# Patient Record
Sex: Female | Born: 1955
Health system: Southern US, Community
[De-identification: ages and names within clinical notes are randomized; demographics above are authoritative.]

## PROBLEM LIST (undated history)

## (undated) DIAGNOSIS — F419 Anxiety disorder, unspecified: Secondary | ICD-10-CM

## (undated) DIAGNOSIS — E785 Hyperlipidemia, unspecified: Secondary | ICD-10-CM

## (undated) DIAGNOSIS — F329 Major depressive disorder, single episode, unspecified: Secondary | ICD-10-CM

## (undated) DIAGNOSIS — M810 Age-related osteoporosis without current pathological fracture: Secondary | ICD-10-CM

## (undated) DIAGNOSIS — K219 Gastro-esophageal reflux disease without esophagitis: Secondary | ICD-10-CM

## (undated) DIAGNOSIS — F32A Depression, unspecified: Secondary | ICD-10-CM

## (undated) HISTORY — DX: Hyperlipidemia, unspecified: E78.5

## (undated) HISTORY — DX: Major depressive disorder, single episode, unspecified: F32.9

## (undated) HISTORY — DX: Depression, unspecified: F32.A

## (undated) HISTORY — PX: COCHLEAR IMPLANT: SUR684

## (undated) HISTORY — PX: WRIST SURGERY: SHX841

## (undated) HISTORY — PX: JOINT REPLACEMENT: SHX530

---

## 2004-03-25 ENCOUNTER — Ambulatory Visit: Payer: Self-pay | Admitting: Family Medicine

## 2004-07-02 ENCOUNTER — Ambulatory Visit: Payer: Self-pay | Admitting: Unknown Physician Specialty

## 2004-08-19 ENCOUNTER — Ambulatory Visit: Payer: Self-pay

## 2004-11-24 ENCOUNTER — Inpatient Hospital Stay: Payer: Self-pay | Admitting: General Practice

## 2005-01-20 ENCOUNTER — Ambulatory Visit: Payer: Self-pay | Admitting: General Practice

## 2005-03-02 ENCOUNTER — Ambulatory Visit: Payer: Self-pay | Admitting: Pain Medicine

## 2005-03-12 ENCOUNTER — Ambulatory Visit: Payer: Self-pay | Admitting: Pain Medicine

## 2005-03-18 ENCOUNTER — Ambulatory Visit: Payer: Self-pay | Admitting: Pain Medicine

## 2005-04-02 ENCOUNTER — Ambulatory Visit: Payer: Self-pay | Admitting: Pain Medicine

## 2005-04-15 ENCOUNTER — Ambulatory Visit: Payer: Self-pay | Admitting: Pain Medicine

## 2005-11-23 ENCOUNTER — Ambulatory Visit: Payer: Self-pay | Admitting: General Practice

## 2006-01-05 ENCOUNTER — Inpatient Hospital Stay: Payer: Self-pay

## 2009-02-27 ENCOUNTER — Ambulatory Visit: Payer: Self-pay

## 2010-03-24 DIAGNOSIS — F4322 Adjustment disorder with anxiety: Secondary | ICD-10-CM | POA: Insufficient documentation

## 2010-03-24 DIAGNOSIS — F39 Unspecified mood [affective] disorder: Secondary | ICD-10-CM | POA: Insufficient documentation

## 2010-12-22 ENCOUNTER — Ambulatory Visit: Payer: Self-pay | Admitting: Unknown Physician Specialty

## 2012-03-10 ENCOUNTER — Emergency Department: Payer: Self-pay | Admitting: Emergency Medicine

## 2012-03-10 LAB — COMPREHENSIVE METABOLIC PANEL
Albumin: 3.9 g/dL (ref 3.4–5.0)
Alkaline Phosphatase: 91 U/L (ref 50–136)
BUN: 9 mg/dL (ref 7–18)
Calcium, Total: 8.8 mg/dL (ref 8.5–10.1)
Co2: 26 mmol/L (ref 21–32)
EGFR (Non-African Amer.): >60
Glucose: 124 mg/dL — ABNORMAL HIGH (ref 65–99)
SGOT(AST): 26 U/L (ref 15–37)
SGPT (ALT): 33 U/L (ref 12–78)
Sodium: 139 mmol/L (ref 136–145)

## 2012-03-10 LAB — CBC WITH DIFFERENTIAL/PLATELET
Basophil %: 0.5 %
Eosinophil #: 0.2 10*3/uL (ref 0.0–0.7)
Eosinophil %: 1.5 %
HCT: 38.7 % (ref 35.0–47.0)
HGB: 12.7 g/dL (ref 12.0–16.0)
Lymphocyte %: 7.8 %
MCHC: 33 g/dL (ref 32.0–36.0)
MCV: 89 fL (ref 80–100)
Monocyte %: 3.4 %
Neutrophil #: 10.7 10*3/uL — ABNORMAL HIGH (ref 1.4–6.5)
Neutrophil %: 86.8 %

## 2012-03-10 LAB — APTT: Activated PTT: 23.1 secs — ABNORMAL LOW (ref 23.6–35.9)

## 2013-01-07 ENCOUNTER — Emergency Department: Payer: Self-pay | Admitting: Emergency Medicine

## 2013-01-07 LAB — URINALYSIS, COMPLETE
Bacteria: NONE SEEN
Bilirubin,UR: NEGATIVE
Ketone: NEGATIVE
Leukocyte Esterase: NEGATIVE
Nitrite: NEGATIVE
Protein: NEGATIVE
RBC,UR: <1 /HPF (ref 0–5)
Specific Gravity: 1.003 (ref 1.003–1.030)
Squamous Epithelial: <1
WBC UR: NONE SEEN /HPF (ref 0–5)

## 2013-01-07 LAB — CBC WITH DIFFERENTIAL/PLATELET
Eosinophil #: 0.2 10*3/uL (ref 0.0–0.7)
Eosinophil %: 2 %
HCT: 37.2 % (ref 35.0–47.0)
MCH: 30.1 pg (ref 26.0–34.0)
MCHC: 34.7 g/dL (ref 32.0–36.0)
MCV: 87 fL (ref 80–100)
Monocyte #: 0.6 x10 3/mm (ref 0.2–0.9)
Neutrophil #: 5.4 10*3/uL (ref 1.4–6.5)
Neutrophil %: 66.9 %
Platelet: 239 10*3/uL (ref 150–440)
RDW: 12.7 % (ref 11.5–14.5)
WBC: 8 10*3/uL (ref 3.6–11.0)

## 2013-01-07 LAB — COMPREHENSIVE METABOLIC PANEL
Bilirubin,Total: 0.4 mg/dL (ref 0.2–1.0)
Chloride: 109 mmol/L — ABNORMAL HIGH (ref 98–107)
Co2: 24 mmol/L (ref 21–32)
Creatinine: 0.71 mg/dL (ref 0.60–1.30)
EGFR (African American): >60
EGFR (Non-African Amer.): >60
Glucose: 88 mg/dL (ref 65–99)
Osmolality: 277 (ref 275–301)
SGPT (ALT): 21 U/L (ref 12–78)
Sodium: 140 mmol/L (ref 136–145)
Total Protein: 6.5 g/dL (ref 6.4–8.2)

## 2014-08-27 ENCOUNTER — Encounter: Payer: Self-pay | Admitting: *Deleted

## 2014-08-27 NOTE — Discharge Instructions (Signed)

## 2014-08-30 ENCOUNTER — Encounter
Admission: RE | Disposition: A | Discharge status: Home / Self Care | Payer: Self-pay | Source: Ambulatory Visit | Attending: Otolaryngology

## 2014-08-30 ENCOUNTER — Ambulatory Visit: Payer: 59 | Admitting: Student in an Organized Health Care Education/Training Program

## 2014-08-30 ENCOUNTER — Encounter: Payer: Self-pay | Admitting: *Deleted

## 2014-08-30 ENCOUNTER — Ambulatory Visit
Admission: RE | Admit: 2014-08-30 | Discharge: 2014-08-30 | Disposition: A | Discharge status: Home / Self Care | Payer: 59 | Source: Ambulatory Visit | Attending: Otolaryngology | Admitting: Otolaryngology

## 2014-08-30 DIAGNOSIS — Z809 Family history of malignant neoplasm, unspecified: Secondary | ICD-10-CM | POA: Insufficient documentation

## 2014-08-30 DIAGNOSIS — Z79899 Other long term (current) drug therapy: Secondary | ICD-10-CM | POA: Diagnosis not present

## 2014-08-30 DIAGNOSIS — Z8261 Family history of arthritis: Secondary | ICD-10-CM | POA: Diagnosis not present

## 2014-08-30 DIAGNOSIS — L814 Other melanin hyperpigmentation: Secondary | ICD-10-CM | POA: Diagnosis not present

## 2014-08-30 DIAGNOSIS — Z9621 Cochlear implant status: Secondary | ICD-10-CM | POA: Diagnosis not present

## 2014-08-30 DIAGNOSIS — L818 Other specified disorders of pigmentation: Secondary | ICD-10-CM | POA: Diagnosis present

## 2014-08-30 DIAGNOSIS — H903 Sensorineural hearing loss, bilateral: Secondary | ICD-10-CM | POA: Diagnosis not present

## 2014-08-30 HISTORY — DX: Anxiety disorder, unspecified: F41.9

## 2014-08-30 HISTORY — DX: Gastro-esophageal reflux disease without esophagitis: K21.9

## 2014-08-30 HISTORY — DX: Age-related osteoporosis without current pathological fracture: M81.0

## 2014-08-30 HISTORY — PX: MINOR EXCISION OF ORAL LESION: SHX6466

## 2014-08-30 SURGERY — MINOR EXCISION OF ORAL LESION
Anesthesia: General | Site: Mouth | Wound class: Clean Contaminated

## 2014-08-30 MED ORDER — LIDOCAINE HCL (CARDIAC) 20 MG/ML IV SOLN
INTRAVENOUS | Status: DC | PRN
  Administered 2014-08-30: 50 mg via INTRATRACHEAL

## 2014-08-30 MED ORDER — LACTATED RINGERS IV SOLN
INTRAVENOUS | Status: DC
  Administered 2014-08-30 (×2): via INTRAVENOUS

## 2014-08-30 MED ORDER — PROMETHAZINE HCL 25 MG/ML IJ SOLN: 6.2500 mg | INTRAMUSCULAR | Status: DC | PRN

## 2014-08-30 MED ORDER — PROPOFOL 10 MG/ML IV BOLUS
INTRAVENOUS | Status: DC | PRN
Start: 2014-08-30 — End: 2014-08-30
  Administered 2014-08-30: 100 mg via INTRAVENOUS

## 2014-08-30 MED ORDER — FENTANYL CITRATE (PF) 100 MCG/2ML IJ SOLN: 25.0000 ug | INTRAMUSCULAR | Status: DC | PRN

## 2014-08-30 MED ORDER — DEXAMETHASONE SODIUM PHOSPHATE 4 MG/ML IJ SOLN
INTRAMUSCULAR | Status: DC | PRN
  Administered 2014-08-30: 4 mg via INTRAVENOUS

## 2014-08-30 MED ORDER — ONDANSETRON HCL 4 MG/2ML IJ SOLN
INTRAMUSCULAR | Status: DC | PRN
  Administered 2014-08-30: 4 mg via INTRAVENOUS

## 2014-08-30 MED ORDER — MIDAZOLAM HCL 5 MG/5ML IJ SOLN
INTRAMUSCULAR | Status: DC | PRN
  Administered 2014-08-30: 2 mg via INTRAVENOUS

## 2014-08-30 MED ORDER — FENTANYL CITRATE (PF) 100 MCG/2ML IJ SOLN
INTRAMUSCULAR | Status: DC | PRN
  Administered 2014-08-30: 50 ug via INTRAVENOUS

## 2014-08-30 MED ORDER — LIDOCAINE-EPINEPHRINE 1 %-1:100000 IJ SOLN
INTRAMUSCULAR | Status: DC | PRN
  Administered 2014-08-30: 1 mL

## 2014-08-30 MED ORDER — OXYCODONE HCL 5 MG/5ML PO SOLN: 5.0000 mg | Freq: Once | ORAL | Status: DC | PRN

## 2014-08-30 MED ORDER — OXYCODONE HCL 5 MG PO TABS: 5.0000 mg | ORAL_TABLET | Freq: Once | ORAL | Status: DC | PRN

## 2014-08-30 SURGICAL SUPPLY — 29 items
BLADE SURG 15 STRL LF DISP TIS (BLADE) ×1 IMPLANT
BLADE SURG 15 STRL SS (BLADE) ×2
CORD BIP STRL DISP 12FT (MISCELLANEOUS) IMPLANT
DRAPE HEAD BAR (DRAPES) IMPLANT
DRESSING TELFA 4X3 1S ST N-ADH (GAUZE/BANDAGES/DRESSINGS) IMPLANT
ELECT CAUTERY BLADE TIP 2.5 (TIP)
ELECT CAUTERY NEEDLE 2.0 MIC (NEEDLE) IMPLANT
ELECTRODE CAUTERY BLDE TIP 2.5 (TIP) IMPLANT
GAUZE SPONGE 4X4 12PLY STRL (GAUZE/BANDAGES/DRESSINGS) IMPLANT
GLOVE PI ULTRA LF STRL 7.5 (GLOVE) ×2 IMPLANT
GLOVE PI ULTRA NON LATEX 7.5 (GLOVE) ×4
LIQUID BAND (GAUZE/BANDAGES/DRESSINGS) IMPLANT
NEEDLE HYPO 25GX1X1/2 BEV (NEEDLE) IMPLANT
NS IRRIG 500ML POUR BTL (IV SOLUTION) IMPLANT
PACK DRAPE NASAL/ENT (PACKS) ×3 IMPLANT
PACK TONSIL/ADENOIDS (PACKS) ×3 IMPLANT
PAD GROUND ADULT SPLIT (MISCELLANEOUS) IMPLANT
PENCIL ELECTRO HAND CTR (MISCELLANEOUS) IMPLANT
SOL ANTI-FOG 6CC FOG-OUT (MISCELLANEOUS) ×1 IMPLANT
SOL FOG-OUT ANTI-FOG 6CC (MISCELLANEOUS) ×2
SOL PREP PVP 2OZ (MISCELLANEOUS)
SOLUTION PREP PVP 2OZ (MISCELLANEOUS) IMPLANT
STRAP BODY AND KNEE 60X3 (MISCELLANEOUS) ×3 IMPLANT
SUCTION FRAZIER TIP 10 FR DISP (SUCTIONS) IMPLANT
SUT PROLENE 5 0 P 3 (SUTURE) IMPLANT
SUT SILK 2 0 PERMA HAND 18 BK (SUTURE) ×3 IMPLANT
SUT VIC AB 4-0 RB1 27 (SUTURE)
SUT VIC AB 4-0 RB1 27X BRD (SUTURE) IMPLANT
SYRINGE 10CC LL (SYRINGE) IMPLANT

## 2014-08-30 NOTE — Anesthesia Postprocedure Evaluation (Signed)
  Anesthesia Post-op Note  Patient: [name]  Procedure(s) Performed: Procedure(s) with comments: MINOR EXCISION OF ORAL LESION (N/A) - HARD PALATE LESION  Anesthesia type:General  Patient location: PACU  Post pain: Pain level controlled  Post assessment: Post-op Vital signs reviewed, Patient's Cardiovascular Status Stable, Respiratory Function Stable, Patent Airway and No signs of Nausea or vomiting  Post vital signs: Reviewed and stable  Last Vitals:  Filed Vitals:   08/30/14 1242  BP:   Pulse: 69  Temp:   Resp: 15    Level of consciousness: awake, alert  and patient cooperative  Complications: No apparent anesthesia complications

## 2014-08-30 NOTE — Anesthesia Preprocedure Evaluation (Signed)
Anesthesia Evaluation  Patient identified by MRN, date of birth, ID band Patient awake    Airway Mallampati: II  TM Distance: >3 FB Neck ROM: Full    Dental   Pulmonary former smoker,          Cardiovascular     Neuro/Psych    GI/Hepatic   Endo/Other    Renal/GU      Musculoskeletal   Abdominal   Peds  Hematology   Anesthesia Other Findings   Reproductive/Obstetrics                             Anesthesia Physical Anesthesia Plan  ASA: II  Anesthesia Plan: General   Post-op Pain Management:    Induction:   Airway Management Planned: LMA  Additional Equipment:   Intra-op Plan:   Post-operative Plan:   Informed Consent: I have reviewed the patients History and Physical, chart, labs and discussed the procedure including the risks, benefits and alternatives for the proposed anesthesia with the patient or authorized representative who has indicated his/her understanding and acceptance.     Plan Discussed with: CRNA  Anesthesia Plan Comments:         Anesthesia Quick Evaluation

## 2014-08-30 NOTE — Anesthesia Procedure Notes (Signed)
Procedure Name: LMA Insertion Performed by: Lamiracle Chaidez Pre-anesthesia Checklist: Patient identified, Emergency Drugs available, Suction available, Timeout performed and Patient being monitored Patient Re-evaluated:Patient Re-evaluated prior to inductionOxygen Delivery Method: Circle system utilized Preoxygenation: Pre-oxygenation with 100% oxygen Intubation Type: IV induction LMA: LMA inserted LMA Size: 4.0 Number of attempts: 1 Placement Confirmation: positive ETCO2 and breath sounds checked- equal and bilateral Tube secured with: Tape       

## 2014-08-30 NOTE — Transfer of Care (Signed)
Immediate Anesthesia Transfer of Care Note  Patient: Cindy Neal  Procedure(s) Performed: Procedure(s) with comments: MINOR EXCISION OF ORAL LESION (N/A) - HARD PALATE LESION  Patient Location: PACU  Anesthesia Type: General  Level of Consciousness: awake, alert  and patient cooperative  Airway and Oxygen Therapy: Patient Spontanous Breathing and Patient connected to supplemental oxygen  Post-op Assessment: Post-op Vital signs reviewed, Patient's Cardiovascular Status Stable, Respiratory Function Stable, Patent Airway and No signs of Nausea or vomiting  Post-op Vital Signs: Reviewed and stable  Complications: No apparent anesthesia complications

## 2014-08-30 NOTE — Op Note (Signed)
08/30/2014  12:33 PM    [name], Cindy Neal  [id number]   Pre-Op Dx:  Pigmented hard palate lesion  Post-op Dx: Pigmented hard palate lesion  Proc: Excision hard palate lesion   Surg:  Cindy Neal  Anes:  GOT  EBL:  Minimal  Comp:  None  Findings:  The patient has a gray-brown pigmented area on the hard palate in the midline near the apex of the hard palate. This was first noted above month or so ago. It is about 4-5 mm in size.  Procedure: The patient was given general anesthesia by LMA. She was in a supine position with her head extended. The hard palate was visualized and the pigmented lesion could be seen in the midline. 1 mL of 1% Xylocaine with epi 1-100,000 was used for infiltration around the lesion. Vasoconstriction could be seen.  Using a sharp scalpel and incision was created around the lesion leaving a millimeter to 2 margin. A freer elevator was used to elevate this all the way down to periosteum and remove the lesion. It was marked with a suture at the anterior margin. Silver nitrate cautery was used for controlling bleeding. There was minimal bleeding and the silver nitrate controlled well.  Dispo:   The patient was awakened and taken to the recovery room in satisfactory condition, to be discharged home from there.  Plan:  She is to follow-up in the office in 1 week to review pathology and make sure the palate is healing well. She'll rinse her mouth with water after each time she eats. She can use Norco 5/325 for pain.  Cindy Neal  08/30/2014 12:33 PM

## 2014-08-30 NOTE — H&P (Signed)
  H&P has been reviewed and no changes necessary. To be downloaded later. 

## 2014-09-01 ENCOUNTER — Encounter: Payer: Self-pay | Admitting: Otolaryngology

## 2014-09-05 LAB — SURGICAL PATHOLOGY

## 2014-10-30 DIAGNOSIS — E785 Hyperlipidemia, unspecified: Secondary | ICD-10-CM | POA: Insufficient documentation

## 2014-10-30 DIAGNOSIS — F329 Major depressive disorder, single episode, unspecified: Secondary | ICD-10-CM | POA: Insufficient documentation

## 2014-10-30 DIAGNOSIS — E039 Hypothyroidism, unspecified: Secondary | ICD-10-CM | POA: Insufficient documentation

## 2014-10-30 DIAGNOSIS — F411 Generalized anxiety disorder: Secondary | ICD-10-CM | POA: Insufficient documentation

## 2014-10-30 DIAGNOSIS — K219 Gastro-esophageal reflux disease without esophagitis: Secondary | ICD-10-CM | POA: Insufficient documentation

## 2014-10-30 DIAGNOSIS — H9190 Unspecified hearing loss, unspecified ear: Secondary | ICD-10-CM | POA: Insufficient documentation

## 2014-10-30 DIAGNOSIS — F3340 Major depressive disorder, recurrent, in remission, unspecified: Secondary | ICD-10-CM

## 2014-10-31 ENCOUNTER — Ambulatory Visit (INDEPENDENT_AMBULATORY_CARE_PROVIDER_SITE_OTHER): Payer: 59 | Admitting: Unknown Physician Specialty

## 2014-10-31 ENCOUNTER — Encounter: Payer: Self-pay | Admitting: Unknown Physician Specialty

## 2014-10-31 VITALS — BP 117/77 | HR 69 | Temp 97.3°F | Ht 65.1 in | Wt 163.2 lb

## 2014-10-31 DIAGNOSIS — J42 Unspecified chronic bronchitis: Secondary | ICD-10-CM

## 2014-10-31 DIAGNOSIS — J069 Acute upper respiratory infection, unspecified: Secondary | ICD-10-CM

## 2014-10-31 DIAGNOSIS — J209 Acute bronchitis, unspecified: Secondary | ICD-10-CM | POA: Diagnosis not present

## 2014-10-31 MED ORDER — HYDROCOD POLST-CPM POLST ER 10-8 MG/5ML PO SUER: 5.0000 mL | Freq: Two times a day (BID) | ORAL | Status: DC | PRN

## 2014-10-31 MED ORDER — DOXYCYCLINE HYCLATE 100 MG PO TABS
100.0000 mg | ORAL_TABLET | Freq: Two times a day (BID) | ORAL | Status: DC
Start: 2014-10-31 — End: 2015-01-08

## 2014-10-31 NOTE — Progress Notes (Signed)
   BP 117/77 mmHg  Pulse 69  Temp(Src) 97.3 F (36.3 C)  Ht 5' 5.1" (1.654 m)  Wt 163 lb 3.2 oz (74.027 kg)  BMI 27.06 kg/m2  SpO2 95%  LMP  (LMP Unknown)   Subjective:    Patient ID: Cindy Neal, female    DOB: 12/13/1955, 59 y.o.   MRN: [id number]  HPI: Cindy Neal is a 59 y.o. female  Chief Complaint  Patient presents with  . Sore Throat    pt states throat has been sore since 10/24/14  . Cough    pt states non-productive cough started a week ago, then cough became productive about 2 days ago  . Wheezing    pt states wheezing started last Thursday   URI  This is a new problem. The current episode started in the past 7 days. The problem has been unchanged. There has been no fever (having chills.  doesn't take temperature). Associated symptoms include congestion, coughing, a sore throat and wheezing. Treatments tried: throat spray.    Relevant past medical, surgical, family and social history reviewed and updated as indicated. Interim medical history since our last visit reviewed. Allergies and medications reviewed and updated.  Review of Systems  HENT: Positive for congestion and sore throat.   Respiratory: Positive for cough and wheezing.     Per HPI unless specifically indicated above     Objective:    BP 117/77 mmHg  Pulse 69  Temp(Src) 97.3 F (36.3 C)  Ht 5' 5.1" (1.654 m)  Wt 163 lb 3.2 oz (74.027 kg)  BMI 27.06 kg/m2  SpO2 95%  LMP  (LMP Unknown)  Wt Readings from Last 3 Encounters:  10/31/14 163 lb 3.2 oz (74.027 kg)  06/14/14 164 lb (74.39 kg)  08/30/14 160 lb (72.576 kg)    Physical Exam  Constitutional: She is oriented to person, place, and time. She appears well-developed and well-nourished. No distress.  HENT:  Head: Normocephalic and atraumatic.  Eyes: Conjunctivae and lids are normal. Right eye exhibits no discharge. Left eye exhibits no discharge. No scleral icterus.  Cardiovascular: Normal rate, regular rhythm and normal heart  sounds.   Pulmonary/Chest: Effort normal. No respiratory distress. She has wheezes in the right lower field. She has rhonchi in the right lower field.  Abdominal: Normal appearance and bowel sounds are normal. She exhibits no distension. There is no splenomegaly or hepatomegaly. There is no tenderness.  Musculoskeletal: Normal range of motion.  Neurological: She is alert and oriented to person, place, and time.  Skin: Skin is intact. No rash noted. No pallor.  Psychiatric: She has a normal mood and affect. Her behavior is normal. Judgment and thought content normal.  Vitals reviewed.     Assessment & Plan:   Problem List Items Addressed This Visit    None    Visit Diagnoses    Upper respiratory infection    -  Primary    Acute exacerbation of chronic bronchitis        r/o pneumonia with Doxycycline    Relevant Medications    chlorpheniramine-HYDROcodone (TUSSIONEX PENNKINETIC ER) 10-8 MG/5ML SUER        Follow up plan: Return if symptoms worsen or fail to improve.

## 2014-11-26 ENCOUNTER — Telehealth: Payer: Self-pay | Admitting: Unknown Physician Specialty

## 2014-11-26 NOTE — Telephone Encounter (Signed)
Patient husband called to cancel wife appt with Elnita Maxwell. He said they do not want her to be treated my Gabriel Cirri. Told patient husband that next thing for a physical with another provider wouldn't be until 1st week of September, husband was upset and hang the phone up.

## 2014-12-04 ENCOUNTER — Encounter: Payer: 59 | Admitting: Unknown Physician Specialty

## 2014-12-19 ENCOUNTER — Other Ambulatory Visit: Payer: Self-pay | Admitting: Family Medicine

## 2014-12-19 NOTE — Telephone Encounter (Signed)
Pt needs check further refills 

## 2015-01-08 ENCOUNTER — Encounter: Payer: Self-pay | Admitting: Unknown Physician Specialty

## 2015-01-08 ENCOUNTER — Ambulatory Visit (INDEPENDENT_AMBULATORY_CARE_PROVIDER_SITE_OTHER): Payer: 59 | Admitting: Unknown Physician Specialty

## 2015-01-08 VITALS — BP 136/82 | HR 90 | Temp 97.9°F | Ht 63.7 in | Wt 162.6 lb

## 2015-01-08 DIAGNOSIS — Z0000 Encounter for general adult medical examination without abnormal findings: Secondary | ICD-10-CM | POA: Diagnosis not present

## 2015-01-08 DIAGNOSIS — T8484XA Pain due to internal orthopedic prosthetic devices, implants and grafts, initial encounter: Secondary | ICD-10-CM

## 2015-01-08 DIAGNOSIS — E039 Hypothyroidism, unspecified: Secondary | ICD-10-CM

## 2015-01-08 DIAGNOSIS — Z23 Encounter for immunization: Secondary | ICD-10-CM | POA: Diagnosis not present

## 2015-01-08 DIAGNOSIS — Z96649 Presence of unspecified artificial hip joint: Secondary | ICD-10-CM

## 2015-01-08 MED ORDER — LEVOTHYROXINE SODIUM 112 MCG PO TABS: 112.0000 ug | ORAL_TABLET | Freq: Every day | ORAL | Status: DC

## 2015-01-08 MED ORDER — RALOXIFENE HCL 60 MG PO TABS: 60.0000 mg | ORAL_TABLET | Freq: Every day | ORAL | Status: DC

## 2015-01-08 MED ORDER — ZOSTER VACCINE LIVE 19400 UNT/0.65ML ~~LOC~~ SOLR: 0.6500 mL | Freq: Once | SUBCUTANEOUS | Status: DC

## 2015-01-08 NOTE — Patient Instructions (Signed)
Write down everything you eat.    Think you're too busy to work out? We have the workout for you. In minutes, high-intensity interval training (H.I.I.T.) will have you sweating, breathing hard and maximizing the health benefits of exercise without the time commitment. Best of all, it's scientifically proven to work.  What Is H.I.I.T.? SHORT WORKOUTS 101 High-intensity interval training - referred to as H.I.I.T. - is based on the idea that short bursts of strenuous exercise can have a big impact on the body. If moderate exercise - like a 20-minute jog - is good for your heart, lungs and metabolism, H.I.I.T. packs the benefits of that workout and more into a few minutes. It may sound too good to be true, but learning this exercise technique and adapting it to your life can mean saving hours at the gym. If you think you don't have time to exercise, H.I.I.T. may be the workout for you.  You can try it with any aerobic activity you like. The principles of H.I.I.T. can be applied to running, biking, stair climbing, swimming, jumping rope, rowing, even hopping or skipping. (Yes, skipping!)  The downside? Even though H.I.I.T. lasts only minutes, the workouts are tough, requiring you to push your body near its limit.  HOW INTENSE IS HIGH INTENSITY? High-intensity exercise is obviously not a casual stroll down the street, but it's not a run-till-your-lungs-pop explosion, either. Think breathless, not winded. Heart-pounding, not exploding. Legs pumping, but not uncontrolled.  You don't need any fancy heart rate monitors to do these workouts. Use cues from your body as a guide. In the middle of a high-intensity workout you should be able to say single words, but not complete whole sentences. So, if you can keep chatting to your workout partner during this workout, pump it up a few notches.  01-30-29 Training This simple program will help you make the most of a short workout by improving heart health and  endurance. Try it with your favorite cardiovascular activity. The essentials of 01-30-29 training are simple. Run, ride or perhaps row on a rowing machine gently for 30 seconds, accelerate to a moderate pace for 20 seconds, then sprint as hard as you can for 10 seconds. (It should be called 30-20-10 training, obviously, but that is not as catchy.) Repeat.  You don't even need a stopwatch to monitor the 30-, 20-, and 10-second time changes. You can just count to yourself, which seems to make the intervals pass more quickly.  Best of all? The grueling, all-out portion of the workout lasts for only 10 seconds. C'mon, you can do anything for 10 seconds, right?  Got 10 Minutes? A solitary minute of hard work buried in 10 minutes of activity can make a big difference.  The 10-Minute Workout If you like to run, bike, row or swim - just a little bit - this workout is a great option for you. Step 1 Warm up for 2 minutes Step 2 Pedal, run or swim all-out for 20 seconds. Repeat 2 more times Warm down for 3 minutes    GET STARTED To benefit the most from really, really short workouts, you need to build the habit of doing them into your hectic life. Ideally, you'll complete the workout three times a week. The best way to build that habit is to start small and be willing to tweak your schedule where you can to accommodate your new workout.  First set up a spot in your house for your workout, equipped with whatever you  need to get the job done: sneakers, a chair, a towel, etc. Then slot your workout in before you would normally shower. (You can even do it in the bathroom.) Or wake up five minutes earlier and do it first thing in the morning, so you can head off to work feeling accomplished. Or do it during your lunch hour. Run up your office's stairs or grab a private conference room for just a few minutes. Or work it into your commute. If you walk or bike to work, add some heavy intervals on the way  home.  GET A BOOST FROM MUSIC Creating a workout playlist of high-energy tunes you love will not make your workout feel easier, but it may cause you to exercise harder without even realizing it. Best of all, if you are doing a really short workout, you need only one or two great tunes to get you through. If you are willing to try something a bit different, make your own music as you exercise. Sing, hum, clap your hands, whatever you can do to jam along to your playlist. It may give you an extra boost to finish strong.  Find a song or podcast that's the length of your really, really short workout. By the time the song is over, you're done.  Excerpted from the [place] [employer] Well column http://www.nytimes.com/well/guides/really-really-short-workouts?smid=fb-nytwell&smtyp=pay

## 2015-01-08 NOTE — Progress Notes (Signed)
BP 136/82 mmHg  Pulse 90  Temp(Src) 97.9 F (36.6 C)  Ht 5' 3.7" (1.618 m)  Wt 162 lb 9.6 oz (73.755 kg)  BMI 28.17 kg/m2  SpO2 95%  LMP  (LMP Unknown)   Subjective:    Patient ID: Cindy Neal, female    DOB: December 13, 1955, 59 y.o.   MRN: [id number]  HPI: Cindy Neal is a 59 y.o. female  Chief Complaint  Patient presents with  . Annual Exam   Pt is frustrated with weight.  She states she can lose "5 pounds" and that is it.    Depression Stable on present meds.  PHQ 2 is 0    Relevant past medical, surgical, family and social history reviewed and updated as indicated. Interim medical history since our last visit reviewed. Allergies and medications reviewed and updated.  Review of Systems  Constitutional:       Frustrated with weight gain  HENT: Negative.   Eyes: Negative.   Respiratory: Negative.   Cardiovascular: Negative.   Gastrointestinal: Negative.   Endocrine: Negative.   Genitourinary: Negative.   Musculoskeletal: Negative.        Right hip bothering her.  S/p hip replacement 10 years ago with a "new hip" ? MOM and a recalled hip  Skin: Negative.   Allergic/Immunologic: Negative.   Neurological: Negative.   Hematological: Negative.   Psychiatric/Behavioral: Negative.     Per HPI unless specifically indicated above     Objective:    BP 136/82 mmHg  Pulse 90  Temp(Src) 97.9 F (36.6 C)  Ht 5' 3.7" (1.618 m)  Wt 162 lb 9.6 oz (73.755 kg)  BMI 28.17 kg/m2  SpO2 95%  LMP  (LMP Unknown)  Wt Readings from Last 3 Encounters:  01/08/15 162 lb 9.6 oz (73.755 kg)  10/31/14 163 lb 3.2 oz (74.027 kg)  06/14/14 164 lb (74.39 kg)    Physical Exam  Constitutional: She is oriented to person, place, and time. She appears well-developed and well-nourished.  HENT:  Head: Normocephalic and atraumatic.  Eyes: Pupils are equal, round, and reactive to light. Right eye exhibits no discharge. Left eye exhibits no discharge. No scleral icterus.  Neck: Normal  range of motion. Neck supple. Carotid bruit is not present. No thyromegaly present.  Cardiovascular: Normal rate, regular rhythm and normal heart sounds.  Exam reveals no gallop and no friction rub.   No murmur heard. Pulmonary/Chest: Effort normal and breath sounds normal. No respiratory distress. She has no wheezes. She has no rales.  Abdominal: Soft. Bowel sounds are normal. There is no tenderness. There is no rebound.  Genitourinary: No breast swelling, tenderness or discharge.  Musculoskeletal: Normal range of motion.  Lymphadenopathy:    She has no cervical adenopathy.  Neurological: She is alert and oriented to person, place, and time.  Skin: Skin is warm, dry and intact. No rash noted.  Psychiatric: She has a normal mood and affect. Her speech is normal and behavior is normal. Judgment and thought content normal. Cognition and memory are normal.    Assessment & Plan:   Problem List Items Addressed This Visit    None    Visit Diagnoses    Immunization due    -  Primary    Relevant Orders    Flu Vaccine QUAD 36+ mos PF IM (Fluarix & Fluzone Quad PF) (Completed)    Routine general medical examination at a health care facility        Relevant Orders  CBC with Differential/Platelet    Comprehensive metabolic panel    Hepatitis C antibody    Lipid Panel w/o Chol/HDL Ratio    TSH    Hypothyroidism, unspecified hypothyroidism type        Relevant Medications    levothyroxine (SYNTHROID, LEVOTHROID) 112 MCG tablet    Hip pain associated with recalled total hip arthroplasty hardware, initial encounter        Relevant Orders    Ambulatory referral to Orthopedic Surgery        Follow up plan: No Follow-up on file.

## 2015-01-09 ENCOUNTER — Encounter: Payer: Self-pay | Admitting: Unknown Physician Specialty

## 2015-01-09 LAB — CBC WITH DIFFERENTIAL/PLATELET
BASOS ABS: 0 10*3/uL (ref 0.0–0.2)
BASOS: 0 %
EOS (ABSOLUTE): 0.2 10*3/uL (ref 0.0–0.4)
Eos: 3 %
Hematocrit: 39.4 % (ref 34.0–46.6)
Hemoglobin: 13.2 g/dL (ref 11.1–15.9)
IMMATURE GRANS (ABS): 0 10*3/uL (ref 0.0–0.1)
Immature Granulocytes: 0 %
LYMPHS: 32 %
Lymphocytes Absolute: 2.1 10*3/uL (ref 0.7–3.1)
MCH: 29.3 pg (ref 26.6–33.0)
MCHC: 33.5 g/dL (ref 31.5–35.7)
MCV: 88 fL (ref 79–97)
Monocytes Absolute: 0.4 10*3/uL (ref 0.1–0.9)
Monocytes: 6 %
NEUTROS ABS: 3.9 10*3/uL (ref 1.4–7.0)
Neutrophils: 59 %
PLATELETS: 268 10*3/uL (ref 150–379)
RBC: 4.5 x10E6/uL (ref 3.77–5.28)
RDW: 13.8 % (ref 12.3–15.4)
WBC: 6.7 10*3/uL (ref 3.4–10.8)

## 2015-01-09 LAB — COMPREHENSIVE METABOLIC PANEL
ALT: 15 IU/L (ref 0–32)
AST: 14 IU/L (ref 0–40)
Albumin/Globulin Ratio: 2.4 (ref 1.1–2.5)
Albumin: 4.3 g/dL (ref 3.6–4.8)
Alkaline Phosphatase: 84 IU/L (ref 39–117)
BUN/Creatinine Ratio: 10 — ABNORMAL LOW (ref 11–26)
BUN: 6 mg/dL — AB (ref 8–27)
Bilirubin Total: <0.2 mg/dL (ref 0.0–1.2)
CALCIUM: 9.3 mg/dL (ref 8.7–10.3)
CO2: 22 mmol/L (ref 18–29)
Chloride: 104 mmol/L (ref 97–108)
Creatinine, Ser: 0.63 mg/dL (ref 0.57–1.00)
GFR calc Af Amer: 113 mL/min/{1.73_m2} (ref >59)
GFR, EST NON AFRICAN AMERICAN: 98 mL/min/{1.73_m2} (ref >59)
GLUCOSE: 87 mg/dL (ref 65–99)
Globulin, Total: 1.8 g/dL (ref 1.5–4.5)
Potassium: 4.3 mmol/L (ref 3.5–5.2)
Sodium: 142 mmol/L (ref 134–144)
TOTAL PROTEIN: 6.1 g/dL (ref 6.0–8.5)

## 2015-01-09 LAB — LIPID PANEL W/O CHOL/HDL RATIO
Cholesterol, Total: 204 mg/dL — ABNORMAL HIGH (ref 100–199)
HDL: 53 mg/dL (ref >39)
LDL CALC: 101 mg/dL — AB (ref 0–99)
TRIGLYCERIDES: 251 mg/dL — AB (ref 0–149)
VLDL CHOLESTEROL CAL: 50 mg/dL — AB (ref 5–40)

## 2015-01-09 LAB — TSH: TSH: 3.16 u[IU]/mL (ref 0.450–4.500)

## 2015-01-09 LAB — HEPATITIS C ANTIBODY: Hep C Virus Ab: <0.1 s/co ratio (ref 0.0–0.9)

## 2015-02-19 ENCOUNTER — Other Ambulatory Visit: Payer: Self-pay | Admitting: Family Medicine

## 2015-03-29 ENCOUNTER — Other Ambulatory Visit: Payer: Self-pay | Admitting: Unknown Physician Specialty

## 2015-03-29 NOTE — Telephone Encounter (Signed)
Last TSH normal; Rx approved 

## 2015-04-12 ENCOUNTER — Observation Stay
Admission: EM | Admit: 2015-04-12 | Discharge: 2015-04-14 | Disposition: A | Discharge status: Home / Self Care | Payer: 59 | Attending: Surgery | Admitting: Surgery

## 2015-04-12 ENCOUNTER — Encounter: Payer: Self-pay | Admitting: Medical Oncology

## 2015-04-12 ENCOUNTER — Emergency Department: Payer: 59

## 2015-04-12 DIAGNOSIS — F411 Generalized anxiety disorder: Secondary | ICD-10-CM | POA: Insufficient documentation

## 2015-04-12 DIAGNOSIS — R109 Unspecified abdominal pain: Secondary | ICD-10-CM

## 2015-04-12 DIAGNOSIS — K219 Gastro-esophageal reflux disease without esophagitis: Secondary | ICD-10-CM | POA: Diagnosis not present

## 2015-04-12 DIAGNOSIS — R1013 Epigastric pain: Secondary | ICD-10-CM | POA: Insufficient documentation

## 2015-04-12 DIAGNOSIS — F4322 Adjustment disorder with anxiety: Secondary | ICD-10-CM | POA: Diagnosis not present

## 2015-04-12 DIAGNOSIS — R1031 Right lower quadrant pain: Secondary | ICD-10-CM | POA: Insufficient documentation

## 2015-04-12 DIAGNOSIS — H9190 Unspecified hearing loss, unspecified ear: Secondary | ICD-10-CM | POA: Diagnosis not present

## 2015-04-12 DIAGNOSIS — E785 Hyperlipidemia, unspecified: Secondary | ICD-10-CM | POA: Insufficient documentation

## 2015-04-12 DIAGNOSIS — E039 Hypothyroidism, unspecified: Secondary | ICD-10-CM | POA: Insufficient documentation

## 2015-04-12 DIAGNOSIS — F39 Unspecified mood [affective] disorder: Secondary | ICD-10-CM | POA: Diagnosis not present

## 2015-04-12 DIAGNOSIS — K819 Cholecystitis, unspecified: Secondary | ICD-10-CM | POA: Diagnosis present

## 2015-04-12 DIAGNOSIS — Z9621 Cochlear implant status: Secondary | ICD-10-CM | POA: Diagnosis not present

## 2015-04-12 DIAGNOSIS — K449 Diaphragmatic hernia without obstruction or gangrene: Secondary | ICD-10-CM | POA: Diagnosis not present

## 2015-04-12 DIAGNOSIS — K8000 Calculus of gallbladder with acute cholecystitis without obstruction: Principal | ICD-10-CM | POA: Insufficient documentation

## 2015-04-12 DIAGNOSIS — Z87891 Personal history of nicotine dependence: Secondary | ICD-10-CM | POA: Diagnosis not present

## 2015-04-12 DIAGNOSIS — Z96641 Presence of right artificial hip joint: Secondary | ICD-10-CM | POA: Insufficient documentation

## 2015-04-12 DIAGNOSIS — N390 Urinary tract infection, site not specified: Secondary | ICD-10-CM | POA: Diagnosis not present

## 2015-04-12 DIAGNOSIS — M810 Age-related osteoporosis without current pathological fracture: Secondary | ICD-10-CM | POA: Insufficient documentation

## 2015-04-12 DIAGNOSIS — F329 Major depressive disorder, single episode, unspecified: Secondary | ICD-10-CM | POA: Insufficient documentation

## 2015-04-12 LAB — URINALYSIS COMPLETE WITH MICROSCOPIC (ARMC ONLY)
Bilirubin Urine: NEGATIVE
Glucose, UA: NEGATIVE mg/dL
KETONES UR: NEGATIVE mg/dL
NITRITE: NEGATIVE
Protein, ur: NEGATIVE mg/dL
Specific Gravity, Urine: 1.005 (ref 1.005–1.030)
pH: 5 (ref 5.0–8.0)

## 2015-04-12 LAB — COMPREHENSIVE METABOLIC PANEL
ALK PHOS: 86 U/L (ref 38–126)
ALT: 20 U/L (ref 14–54)
ANION GAP: 10 (ref 5–15)
AST: 15 U/L (ref 15–41)
Albumin: 4 g/dL (ref 3.5–5.0)
BUN: 9 mg/dL (ref 6–20)
CO2: 26 mmol/L (ref 22–32)
Calcium: 9.8 mg/dL (ref 8.9–10.3)
Chloride: 103 mmol/L (ref 101–111)
Creatinine, Ser: 0.74 mg/dL (ref 0.44–1.00)
GFR calc Af Amer: >60 mL/min (ref >60)
GFR calc non Af Amer: >60 mL/min (ref >60)
GLUCOSE: 103 mg/dL — AB (ref 65–99)
POTASSIUM: 3.5 mmol/L (ref 3.5–5.1)
SODIUM: 139 mmol/L (ref 135–145)
Total Bilirubin: 0.9 mg/dL (ref 0.3–1.2)
Total Protein: 7.3 g/dL (ref 6.5–8.1)

## 2015-04-12 LAB — CBC WITH DIFFERENTIAL/PLATELET
Basophils Absolute: 0.1 10*3/uL (ref 0–0.1)
Basophils Relative: 1 %
EOS ABS: 0.1 10*3/uL (ref 0–0.7)
Eosinophils Relative: 1 %
HCT: 40.7 % (ref 35.0–47.0)
Hemoglobin: 13.4 g/dL (ref 12.0–16.0)
LYMPHS ABS: 1.7 10*3/uL (ref 1.0–3.6)
LYMPHS PCT: 14 %
MCH: 29 pg (ref 26.0–34.0)
MCHC: 33.1 g/dL (ref 32.0–36.0)
MCV: 87.8 fL (ref 80.0–100.0)
Monocytes Absolute: 1.1 10*3/uL — ABNORMAL HIGH (ref 0.2–0.9)
Monocytes Relative: 9 %
Neutro Abs: 9.4 10*3/uL — ABNORMAL HIGH (ref 1.4–6.5)
Neutrophils Relative %: 75 %
Platelets: 265 10*3/uL (ref 150–440)
RBC: 4.63 MIL/uL (ref 3.80–5.20)
RDW: 13.2 % (ref 11.5–14.5)
WBC: 12.3 10*3/uL — AB (ref 3.6–11.0)

## 2015-04-12 LAB — LIPASE, BLOOD: Lipase: 15 U/L (ref 11–51)

## 2015-04-12 MED ORDER — ARIPIPRAZOLE 2 MG PO TABS
10.0000 mg | ORAL_TABLET | Freq: Every day | ORAL | Status: DC
  Administered 2015-04-13 – 2015-04-14 (×2): 10 mg via ORAL
  Filled 2015-04-12: qty 5
  Filled 2015-04-12: qty 1

## 2015-04-12 MED ORDER — DEXTROSE 5 % IV SOLN
1.0000 g | Freq: Once | INTRAVENOUS | Status: AC
  Administered 2015-04-12: 1 g via INTRAVENOUS
  Filled 2015-04-12: qty 10

## 2015-04-12 MED ORDER — DEXTROSE 5 % IV SOLN
2.0000 g | INTRAVENOUS | Status: DC
  Administered 2015-04-13 – 2015-04-14 (×2): 2 g via INTRAVENOUS
  Filled 2015-04-12 (×2): qty 2

## 2015-04-12 MED ORDER — KETOROLAC TROMETHAMINE 30 MG/ML IJ SOLN: 30.0000 mg | Freq: Four times a day (QID) | INTRAMUSCULAR | Status: DC | PRN

## 2015-04-12 MED ORDER — MORPHINE SULFATE (PF) 2 MG/ML IV SOLN
INTRAVENOUS | Status: AC
  Administered 2015-04-12: 2 mg via INTRAVENOUS
  Filled 2015-04-12: qty 1

## 2015-04-12 MED ORDER — LACTATED RINGERS IV SOLN
INTRAVENOUS | Status: DC
  Administered 2015-04-12 – 2015-04-14 (×4): via INTRAVENOUS

## 2015-04-12 MED ORDER — PANTOPRAZOLE SODIUM 40 MG PO TBEC
40.0000 mg | DELAYED_RELEASE_TABLET | Freq: Every day | ORAL | Status: DC
  Administered 2015-04-13 – 2015-04-14 (×2): 40 mg via ORAL
  Filled 2015-04-12 (×2): qty 1

## 2015-04-12 MED ORDER — ALPRAZOLAM 0.5 MG PO TABS
0.5000 mg | ORAL_TABLET | Freq: Two times a day (BID) | ORAL | Status: DC
  Administered 2015-04-13 – 2015-04-14 (×3): 0.5 mg via ORAL
  Filled 2015-04-12 (×3): qty 1

## 2015-04-12 MED ORDER — ONDANSETRON 4 MG PO TBDP: 4.0000 mg | ORAL_TABLET | Freq: Four times a day (QID) | ORAL | Status: DC | PRN

## 2015-04-12 MED ORDER — MORPHINE SULFATE (PF) 2 MG/ML IV SOLN
2.0000 mg | INTRAVENOUS | Status: DC | PRN
  Administered 2015-04-12 – 2015-04-13 (×5): 2 mg via INTRAVENOUS
  Filled 2015-04-12 (×4): qty 1

## 2015-04-12 MED ORDER — BUPROPION HCL ER (XL) 150 MG PO TB24
300.0000 mg | ORAL_TABLET | Freq: Every day | ORAL | Status: DC
  Administered 2015-04-13 – 2015-04-14 (×2): 300 mg via ORAL
  Filled 2015-04-12: qty 1
  Filled 2015-04-12: qty 2

## 2015-04-12 MED ORDER — OXYCODONE-ACETAMINOPHEN 5-325 MG PO TABS
1.0000 | ORAL_TABLET | ORAL | Status: DC | PRN
  Administered 2015-04-13: 1 via ORAL
  Administered 2015-04-14: 2 via ORAL
  Administered 2015-04-14: 1 via ORAL
  Administered 2015-04-14: 2 via ORAL
  Filled 2015-04-12: qty 1
  Filled 2015-04-12: qty 2
  Filled 2015-04-12: qty 1
  Filled 2015-04-12: qty 2

## 2015-04-12 MED ORDER — ONDANSETRON HCL 4 MG/2ML IJ SOLN: 4.0000 mg | Freq: Four times a day (QID) | INTRAMUSCULAR | Status: DC | PRN

## 2015-04-12 MED ORDER — DIPHENHYDRAMINE HCL 12.5 MG/5ML PO ELIX
12.5000 mg | ORAL_SOLUTION | Freq: Four times a day (QID) | ORAL | Status: DC | PRN
  Filled 2015-04-12: qty 5

## 2015-04-12 MED ORDER — DIPHENHYDRAMINE HCL 50 MG/ML IJ SOLN: 12.5000 mg | Freq: Four times a day (QID) | INTRAMUSCULAR | Status: DC | PRN

## 2015-04-12 MED ORDER — MIRTAZAPINE 15 MG PO TABS
30.0000 mg | ORAL_TABLET | Freq: Every day | ORAL | Status: DC
  Administered 2015-04-12 – 2015-04-13 (×2): 30 mg via ORAL
  Filled 2015-04-12: qty 1
  Filled 2015-04-12: qty 2
  Filled 2015-04-12: qty 1

## 2015-04-12 MED ORDER — LEVOTHYROXINE SODIUM 112 MCG PO TABS
112.0000 ug | ORAL_TABLET | Freq: Every morning | ORAL | Status: DC
  Administered 2015-04-13 – 2015-04-14 (×2): 112 ug via ORAL
  Filled 2015-04-12 (×2): qty 1

## 2015-04-12 MED ORDER — IOHEXOL 240 MG/ML SOLN
25.0000 mL | Freq: Once | INTRAMUSCULAR | Status: AC | PRN
  Administered 2015-04-12: 25 mL via ORAL

## 2015-04-12 MED ORDER — IOHEXOL 300 MG/ML  SOLN
100.0000 mL | Freq: Once | INTRAMUSCULAR | Status: AC | PRN
  Administered 2015-04-12: 100 mL via INTRAVENOUS

## 2015-04-12 MED ORDER — IBUPROFEN 400 MG PO TABS
600.0000 mg | ORAL_TABLET | Freq: Four times a day (QID) | ORAL | Status: DC | PRN
  Filled 2015-04-12: qty 1

## 2015-04-12 MED ORDER — HYDRALAZINE HCL 20 MG/ML IJ SOLN: 10.0000 mg | INTRAMUSCULAR | Status: DC | PRN

## 2015-04-12 NOTE — H&P (Signed)
Cindy Neal is an 59 y.o. female.   Chief Complaint: Epigastric and Right sided abdominal pain HPI:  59 yr old female who comes with the complaint of epigastric pain starting yesterday afternoon after eating hamburger and fries.  Patient states pain comes on shortly after eating and was a 6 of 10 and did not radiate. She endorsed bloating, increased flatus, chills, fatigue and malaise along with pain.  She states that was able to eventually sleep and the pain lasted throughout the night but then moved to her right side and began to worsen.  She states that she was not nauseated but had no appetite.  She denies ever having any pain like this before.  She also denies any chest pain, palpitation, sob, nausea, vomiting, diarrhea, constipation, melena, dysuria or hematuria.   Past Medical History  Diagnosis Date  . Anxiety   . Osteoporosis   . GERD (gastroesophageal reflux disease)   . Hypothyroidism   . Depression   . Hyperlipidemia     Past Surgical History  Procedure Laterality Date  . Cochlear implant Bilateral 10/15, 04/16  . Joint replacement Right     Hip  . Wrist surgery Right   . Minor excision of oral lesion N/A 08/30/2014    Procedure: MINOR EXCISION OF ORAL LESION;  Surgeon: Margaretha Sheffield, MD;  Location: [facility];  Service: ENT;  Laterality: N/A;  HARD PALATE LESION    Family History  Problem Relation Age of Onset  . Cancer Mother     lung  . Hypertension Mother   . Hyperlipidemia Mother   . Lung disease Maternal Grandmother   . Emphysema Father   . Thyroid disease Father    Social History:  reports that she quit smoking about 9 years ago. She has never used smokeless tobacco. She reports that she does not drink alcohol or use illicit drugs.  Allergies: No Known Allergies   (Not in a hospital admission)  Results for orders placed or performed during the hospital encounter of 04/12/15 (from the past 48 hour(s))  CBC with Differential     Status: Abnormal    Collection Time: 04/12/15  2:13 PM  Result Value Ref Range   WBC 12.3 (H) 3.6 - 11.0 K/uL   RBC 4.63 3.80 - 5.20 MIL/uL   Hemoglobin 13.4 12.0 - 16.0 g/dL   HCT 40.7 35.0 - 47.0 %   MCV 87.8 80.0 - 100.0 fL   MCH 29.0 26.0 - 34.0 pg   MCHC 33.1 32.0 - 36.0 g/dL   RDW 13.2 11.5 - 14.5 %   Platelets 265 150 - 440 K/uL   Neutrophils Relative % 75 %   Neutro Abs 9.4 (H) 1.4 - 6.5 K/uL   Lymphocytes Relative 14 %   Lymphs Abs 1.7 1.0 - 3.6 K/uL   Monocytes Relative 9 %   Monocytes Absolute 1.1 (H) 0.2 - 0.9 K/uL   Eosinophils Relative 1 %   Eosinophils Absolute 0.1 0 - 0.7 K/uL   Basophils Relative 1 %   Basophils Absolute 0.1 0 - 0.1 K/uL  Comprehensive metabolic panel     Status: Abnormal   Collection Time: 04/12/15  2:13 PM  Result Value Ref Range   Sodium 139 135 - 145 mmol/L   Potassium 3.5 3.5 - 5.1 mmol/L   Chloride 103 101 - 111 mmol/L   CO2 26 22 - 32 mmol/L   Glucose, Bld 103 (H) 65 - 99 mg/dL   BUN 9 6 - 20  mg/dL   Creatinine, Ser 0.74 0.44 - 1.00 mg/dL   Calcium 9.8 8.9 - 10.3 mg/dL   Total Protein 7.3 6.5 - 8.1 g/dL   Albumin 4.0 3.5 - 5.0 g/dL   AST 15 15 - 41 U/L   ALT 20 14 - 54 U/L   Alkaline Phosphatase 86 38 - 126 U/L   Total Bilirubin 0.9 0.3 - 1.2 mg/dL   GFR calc non Af Amer >60 >60 mL/min   GFR calc Af Amer >60 >60 mL/min    Comment: (NOTE) The eGFR has been calculated using the CKD EPI equation. This calculation has not been validated in all clinical situations. eGFR's persistently <60 mL/min signify possible Chronic Kidney Disease.    Anion gap 10 5 - 15  Lipase, blood     Status: None   Collection Time: 04/12/15  2:13 PM  Result Value Ref Range   Lipase 15 11 - 51 U/L  Urinalysis complete, with microscopic     Status: Abnormal   Collection Time: 04/12/15  2:13 PM  Result Value Ref Range   Color, Urine YELLOW (A) YELLOW   APPearance CLOUDY (A) CLEAR   Glucose, UA NEGATIVE NEGATIVE mg/dL   Bilirubin Urine NEGATIVE NEGATIVE   Ketones, ur  NEGATIVE NEGATIVE mg/dL   Specific Gravity, Urine 1.005 1.005 - 1.030   Hgb urine dipstick 1+ (A) NEGATIVE   pH 5.0 5.0 - 8.0   Protein, ur NEGATIVE NEGATIVE mg/dL   Nitrite NEGATIVE NEGATIVE   Leukocytes, UA 3+ (A) NEGATIVE   RBC / HPF 0-5 0 - 5 RBC/hpf   WBC, UA TOO NUMEROUS TO COUNT 0 - 5 WBC/hpf   Bacteria, UA MANY (A) NONE SEEN   Squamous Epithelial / LPF 0-5 (A) NONE SEEN   WBC Clumps PRESENT    Mucous PRESENT    Ct Abdomen Pelvis W Contrast  04/12/2015  CLINICAL DATA:  Acute right lower quadrant abdominal pain. EXAM: CT ABDOMEN AND PELVIS WITH CONTRAST TECHNIQUE: Multidetector CT imaging of the abdomen and pelvis was performed using the standard protocol following bolus administration of intravenous contrast. CONTRAST:  179m OMNIPAQUE IOHEXOL 300 MG/ML  SOLN COMPARISON:  None. FINDINGS: Visualized lung bases are unremarkable. Moderate hiatal hernia is noted. No significant osseous abnormality is noted. At least 2 stones are noted in the neck of the gallbladder. Significant gallbladder wall thickening and surrounding inflammation is noted consistent with acute cholecystitis. The liver, spleen and pancreas are unremarkable. Adrenal glands and kidneys appear normal. No hydronephrosis or renal obstruction is noted. The appendix appears normal. There is no evidence of bowel obstruction. There is no evidence of abdominal aortic aneurysm. Visualized portion of urinary bladder appears normal. Pelvis is obscured due to artifact arising from right hip prosthesis. Small amount of free fluid is noted in the dependent portion of the pelvis. Uterus and ovaries appear normal. No significant adenopathy is noted. IMPRESSION: Moderate size sliding-type hiatal hernia. Significant gallbladder wall thickening and surrounding inflammation is noted consistent with acute cholecystitis, secondary to at least 2 stones noted in the neck of the gallbladder. Electronically Signed   By: JMarijo Conception M.D.   On:  04/12/2015 16:20    Review of Systems  Constitutional: Positive for fever, chills and malaise/fatigue. Negative for weight loss and diaphoresis.  HENT: Negative for congestion and sore throat.   Respiratory: Negative for cough, sputum production, shortness of breath and wheezing.   Cardiovascular: Negative for chest pain, palpitations and leg swelling.  Gastrointestinal: Positive for abdominal  pain. Negative for heartburn, nausea, vomiting, diarrhea, constipation, blood in stool and melena.  Genitourinary: Negative for dysuria, urgency, frequency, hematuria and flank pain.  Musculoskeletal: Negative for myalgias, back pain and joint pain.  Skin: Negative for itching and rash.  Neurological: Positive for weakness. Negative for dizziness and loss of consciousness.  Psychiatric/Behavioral: The patient is nervous/anxious.   All other systems reviewed and are negative.   Blood pressure 149/96, pulse 90, temperature 98.5 F (36.9 C), temperature source Oral, resp. rate 16, height _0  (1.676 m), weight 163 lb (73.936 kg), SpO2 95 %. Physical Exam  Vitals reviewed. Constitutional: She is oriented to person, place, and time. She appears well-developed and well-nourished. No distress.  HENT:  Head: Normocephalic and atraumatic.  Right Ear: External ear normal.  Left Ear: External ear normal.  Nose: Nose normal.  Mouth/Throat: Oropharynx is clear and moist. No oropharyngeal exudate.  Eyes: Conjunctivae are normal. Pupils are equal, round, and reactive to light. No scleral icterus.  Neck: Normal range of motion. Neck supple. No tracheal deviation present.  Cardiovascular: Normal rate, regular rhythm, normal heart sounds and intact distal pulses.  Exam reveals no gallop and no friction rub.   No murmur heard. Respiratory: Breath sounds normal. No respiratory distress. She has no wheezes. She has no rales.  GI: Soft. Bowel sounds are normal. She exhibits no distension and no mass. There is  tenderness. There is guarding. There is no rebound.  Epigastric and RUQ pain, + Murphy's sign, no pain elsewhere, negative CVA tenderness  Musculoskeletal: Normal range of motion. She exhibits no edema or tenderness.  Neurological: She is alert and oriented to person, place, and time. No cranial nerve deficit.  Skin: Skin is warm and dry. No rash noted. No erythema. No pallor.  Psychiatric: She has a normal mood and affect. Her behavior is normal. Judgment and thought content normal.     Assessment/Plan 59 yr old with acute cholecystitis.  I have personally reviewed the patients past medical record, including her anxiety disease and recent medication changes.  I have reviewed her laboratory values showing elevated WBC, normal LFTs and + UA.  I have personally reviewed her images showing a thickened, inflamed gallbladder wall with stone in the infundibulum.  I have also reviewed the radiology read.  Given the above information and her clinical picture feel she would benefit from laparoscopic cholecystectomy.  The risks, benefits, complications, treatment options, and expected outcomes were discussed with the patient. The possibilities of bleeding, recurrent infection, finding a normal gallbladder, perforation of viscus organs, damage to surrounding structures, bile leak, abscess formation, needing a drain placed, the need for additional procedures, reaction to medication, pulmonary aspiration,  failure to diagnose a condition, the possible need to convert to an open procedure, and creating a complication requiring transfusion or operation were discussed with the patient. The patient concurred with the proposed plan, giving informed consent. Will admit her, continue Rocephin to treat both the cholecystitis and possible UTI and post her for the OR tomorrow.    Lavona Mound Laurelle Skiver 04/12/2015, 6:17 PM

## 2015-04-12 NOTE — ED Notes (Signed)
MD Goodman at bedside. 

## 2015-04-12 NOTE — ED Notes (Signed)
MD Loflin at bedside.

## 2015-04-12 NOTE — ED Notes (Signed)
Pt reports she began having abd pain yesterday, today pain has lowered in her abd and began to radiate around to rt flank. Pt also reports she has been feeling "feverish". Denies nvd.

## 2015-04-12 NOTE — ED Notes (Signed)
Pt given some ice chips with water, sitting up in bed drinking tolerating well, no acute distress noted.

## 2015-04-12 NOTE — ED Notes (Signed)
Pt given contrast at this time by Arnel, sitting up in bed drinking and tolerating well. No acute distress noted.

## 2015-04-12 NOTE — ED Provider Notes (Signed)
[facility] Emergency Department Provider Note   ____________________________________________  Time seen: 1510  I have reviewed the triage vital signs and the nursing notes.   HISTORY  Chief Complaint Abdominal Pain   History limited by: Not Limited   HPI Cindy Neal is a 59 y.o. female who presents to the emergency department today because of concerns for abdominal pain. The patient stated that her pain started yesterday. Initially it was in the epigastric and periumbilical region. She stated that overnight it seemed to get a little bit better. This morning however when she woke up the pain had gotten back. It is now located primarily in the right lower quadrant. She describes it as a cramping type pain. It has been constant today. It is not been associated with any nausea or vomiting. She has not had any change in her bowel movements. She has felt like she has had a fever. She has never had pain like this before. No previous intra-abdominal surgeries.   Past Medical History  Diagnosis Date  . Anxiety   . Osteoporosis   . GERD (gastroesophageal reflux disease)   . Hypothyroidism   . Depression   . Hyperlipidemia     Patient Active Problem List   Diagnosis Date Noted  . Anxiety state 10/30/2014  . Difficulty hearing 10/30/2014  . Disease of thyroid gland 10/30/2014  . GERD (gastroesophageal reflux disease) 10/30/2014  . Major depressive disorder (HCC) 10/30/2014  . Hyperlipidemia 10/30/2014  . Deaf 10/30/2014  . Adjustment disorder with anxiety 03/24/2010  . Affective disorder (HCC) 03/24/2010    Past Surgical History  Procedure Laterality Date  . Cochlear implant Bilateral 10/15, 04/16  . Joint replacement Right     Hip  . Wrist surgery Right   . Minor excision of oral lesion N/A 08/30/2014    Procedure: MINOR EXCISION OF ORAL LESION;  Surgeon: Vernie Murders, MD;  Location: [facility];  Service: ENT;  Laterality: N/A;  HARD PALATE  LESION    Current Outpatient Rx  Name  Route  Sig  Dispense  Refill  . ALPRAZolam (XANAX) 0.5 MG tablet   Oral   Take 0.5 mg by mouth 2 (two) times daily.         . ARIPiprazole (ABILIFY) 10 MG tablet   Oral   Take 10 mg by mouth daily. AM         . buPROPion (WELLBUTRIN XL) 300 MG 24 hr tablet   Oral   Take 300 mg by mouth daily. AM         . gabapentin (NEURONTIN) 300 MG capsule   Oral   Take 300 mg by mouth 2 (two) times daily.         Marland Kitchen levothyroxine (SYNTHROID, LEVOTHROID) 112 MCG tablet   Oral   Take 1 tablet (112 mcg total) by mouth daily before breakfast.   90 tablet   3   . levothyroxine (SYNTHROID, LEVOTHROID) 112 MCG tablet      TAKE 1 TABLET (112 MCG TOTAL) BY MOUTH DAILY BEFORE BREAKFAST.   90 tablet   0   . loratadine (CLARITIN) 10 MG tablet   Oral   Take 10 mg by mouth daily. AM         . mirtazapine (REMERON) 30 MG tablet   Oral   Take 30 mg by mouth at bedtime.         Marland Kitchen omeprazole (PRILOSEC) 20 MG capsule   Oral   Take 20 mg  by mouth daily. AM         . raloxifene (EVISTA) 60 MG tablet      TAKE 1 TABLET BY MOUTH EVERY DAY   90 tablet   4   . VITAMIN D, ERGOCALCIFEROL, PO   Oral   Take by mouth. AM         . zoster vaccine live, PF, (ZOSTAVAX) 16109 UNT/0.65ML injection   Subcutaneous   Inject 19,400 Units into the skin once.   1 each   0     Allergies Review of patient's allergies indicates no known allergies.  Family History  Problem Relation Age of Onset  . Cancer Mother     lung  . Hypertension Mother   . Hyperlipidemia Mother   . Lung disease Maternal Grandmother   . Emphysema Father   . Thyroid disease Father     Social History Social History  Substance Use Topics  . Smoking status: Former Smoker    Quit date: 04/13/2006  . Smokeless tobacco: Never Used  . Alcohol Use: No    Review of Systems  Constitutional: Positive for subjective fever Cardiovascular: Negative for chest  pain. Respiratory: Negative for shortness of breath. Gastrointestinal: Positive for abdominal pain, negative for diarrhea or vomiting. Neurological: Negative for headaches, focal weakness or numbness.   10-point ROS otherwise negative.  ____________________________________________   PHYSICAL EXAM:  VITAL SIGNS: ED Triage Vitals  Enc Vitals Group     BP 04/12/15 1228 144/84 mmHg     Pulse Rate 04/12/15 1228 97     Resp 04/12/15 1228 18     Temp 04/12/15 1228 98.5 F (36.9 C)     Temp Source 04/12/15 1228 Oral     SpO2 04/12/15 1228 96 %     Weight 04/12/15 1228 163 lb (73.936 kg)     Height 04/12/15 1228  (1.676 m)     Head Cir --      Peak Flow --      Pain Score 04/12/15 1228 5   Constitutional: Alert and oriented. Well appearing and in no distress. Eyes: Conjunctivae are normal. PERRL. Normal extraocular movements. ENT   Head: Normocephalic and atraumatic.   Nose: No congestion/rhinnorhea.   Mouth/Throat: Mucous membranes are moist.   Neck: No stridor. Hematological/Lymphatic/Immunilogical: No cervical lymphadenopathy. Cardiovascular: Normal rate, regular rhythm.  No murmurs, rubs, or gallops. Respiratory: Normal respiratory effort without tachypnea nor retractions. Breath sounds are clear and equal bilaterally. No wheezes/rales/rhonchi. Gastrointestinal: Soft. No distention. Mild tenderness in the right side of the abdomen. Negative Rovsing's. No rebound. No guarding. Genitourinary: Deferred Musculoskeletal: Normal range of motion in all extremities. No joint effusions.  No lower extremity tenderness nor edema. Neurologic:  Normal speech and language. No gross focal neurologic deficits are appreciated.  Skin:  Skin is warm, dry and intact. No rash noted. Psychiatric: Mood and affect are normal. Speech and behavior are normal. Patient exhibits appropriate insight and judgment.  ____________________________________________    LABS (pertinent  positives/negatives)  Labs Reviewed  CBC WITH DIFFERENTIAL/PLATELET - Abnormal; Notable for the following:    WBC 12.3 (*)    Neutro Abs 9.4 (*)    Monocytes Absolute 1.1 (*)    All other components within normal limits  COMPREHENSIVE METABOLIC PANEL - Abnormal; Notable for the following:    Glucose, Bld 103 (*)    All other components within normal limits  URINALYSIS COMPLETEWITH MICROSCOPIC ([facility] ONLY) - Abnormal; Notable for the following:    Color, Urine YELLOW (*)  APPearance CLOUDY (*)    Hgb urine dipstick 1+ (*)    Leukocytes, UA 3+ (*)    Bacteria, UA MANY (*)    Squamous Epithelial / LPF 0-5 (*)    All other components within normal limits  LIPASE, BLOOD     ____________________________________________   EKG  None  ____________________________________________    RADIOLOGY  CT abd/pel IMPRESSION: Moderate size sliding-type hiatal hernia.  Significant gallbladder wall thickening and surrounding inflammation is noted consistent with acute cholecystitis, secondary to at least 2 stones noted in the neck of the gallbladder.  ____________________________________________   PROCEDURES  Procedure(s) performed: None  Critical Care performed: No  ____________________________________________   INITIAL IMPRESSION / ASSESSMENT AND PLAN / ED COURSE  Pertinent labs & imaging results that were available during my care of the patient were reviewed by me and considered in my medical decision making (see chart for details).  Patient presented to the emergency department today because of concerns for abdominal pain. On exam she has right-sided abdominal pain. CT abdomen and pelvis was obtained which is concerning for cholecystitis. Additionally urine was concerning for possible urinary tract infection. The patient will be admitted under the care of the surgery team.  ____________________________________________   FINAL CLINICAL IMPRESSION(S) / ED  DIAGNOSES  Final diagnoses:  Cholecystitis  UTI (lower urinary tract infection)  Abdominal pain, unspecified abdominal location     Phineas SemenGraydon Nawaf Strange, MD 04/12/15 [phone]

## 2015-04-13 ENCOUNTER — Observation Stay: Payer: 59 | Admitting: Registered Nurse

## 2015-04-13 ENCOUNTER — Encounter
Admission: EM | Disposition: A | Discharge status: Home / Self Care | Payer: Self-pay | Source: Home / Self Care | Attending: Emergency Medicine

## 2015-04-13 ENCOUNTER — Encounter: Payer: Self-pay | Admitting: Anesthesiology

## 2015-04-13 DIAGNOSIS — K8000 Calculus of gallbladder with acute cholecystitis without obstruction: Secondary | ICD-10-CM | POA: Diagnosis not present

## 2015-04-13 HISTORY — PX: CHOLECYSTECTOMY: SHX55

## 2015-04-13 LAB — MRSA PCR SCREENING: MRSA BY PCR: NEGATIVE

## 2015-04-13 SURGERY — LAPAROSCOPIC CHOLECYSTECTOMY
Anesthesia: General | Wound class: Clean Contaminated

## 2015-04-13 MED ORDER — KETOROLAC TROMETHAMINE 30 MG/ML IJ SOLN
INTRAMUSCULAR | Status: DC | PRN
  Administered 2015-04-13: 30 mg via INTRAVENOUS

## 2015-04-13 MED ORDER — PROPOFOL 10 MG/ML IV BOLUS
INTRAVENOUS | Status: DC | PRN
  Administered 2015-04-13: 150 mg via INTRAVENOUS

## 2015-04-13 MED ORDER — GLYCOPYRROLATE 0.2 MG/ML IJ SOLN
INTRAMUSCULAR | Status: DC | PRN
  Administered 2015-04-13: 0.2 mg via INTRAVENOUS
  Administered 2015-04-13: 0.4 mg via INTRAVENOUS

## 2015-04-13 MED ORDER — DEXAMETHASONE SODIUM PHOSPHATE 10 MG/ML IJ SOLN
INTRAMUSCULAR | Status: DC | PRN
  Administered 2015-04-13: 5 mg via INTRAVENOUS

## 2015-04-13 MED ORDER — FENTANYL CITRATE (PF) 100 MCG/2ML IJ SOLN: 25.0000 ug | INTRAMUSCULAR | Status: DC | PRN

## 2015-04-13 MED ORDER — NEOSTIGMINE METHYLSULFATE 10 MG/10ML IV SOLN
INTRAVENOUS | Status: DC | PRN
  Administered 2015-04-13: 3 mg via INTRAVENOUS

## 2015-04-13 MED ORDER — MIDAZOLAM HCL 2 MG/2ML IJ SOLN
INTRAMUSCULAR | Status: DC | PRN
  Administered 2015-04-13: 2 mg via INTRAVENOUS

## 2015-04-13 MED ORDER — PROMETHAZINE HCL 25 MG/ML IJ SOLN: 6.2500 mg | INTRAMUSCULAR | Status: DC | PRN

## 2015-04-13 MED ORDER — ONDANSETRON HCL 4 MG/2ML IJ SOLN
INTRAMUSCULAR | Status: DC | PRN
  Administered 2015-04-13: 4 mg via INTRAVENOUS

## 2015-04-13 MED ORDER — ACETAMINOPHEN 10 MG/ML IV SOLN
INTRAVENOUS | Status: DC | PRN
  Administered 2015-04-13: 1000 mg via INTRAVENOUS

## 2015-04-13 MED ORDER — LACTATED RINGERS IV SOLN
INTRAVENOUS | Status: DC | PRN
  Administered 2015-04-13 (×2): via INTRAVENOUS

## 2015-04-13 MED ORDER — ROCURONIUM BROMIDE 100 MG/10ML IV SOLN
INTRAVENOUS | Status: DC | PRN
  Administered 2015-04-13: 10 mg via INTRAVENOUS
  Administered 2015-04-13: 40 mg via INTRAVENOUS

## 2015-04-13 MED ORDER — PHENYLEPHRINE HCL 10 MG/ML IJ SOLN
INTRAMUSCULAR | Status: DC | PRN
  Administered 2015-04-13: 100 ug via INTRAVENOUS

## 2015-04-13 MED ORDER — LIDOCAINE HCL 2 % EX GEL
CUTANEOUS | Status: DC | PRN
  Administered 2015-04-13: 1 via TOPICAL

## 2015-04-13 MED ORDER — BUPIVACAINE HCL (PF) 0.5 % IJ SOLN
INTRAMUSCULAR | Status: DC | PRN
  Administered 2015-04-13: 30 mL

## 2015-04-13 MED ORDER — LIDOCAINE HCL (CARDIAC) 20 MG/ML IV SOLN
INTRAVENOUS | Status: DC | PRN
  Administered 2015-04-13: 100 mg via INTRAVENOUS

## 2015-04-13 MED ORDER — VITAMIN D3 25 MCG (1000 UNIT) PO TABS
1000.0000 [IU] | ORAL_TABLET | Freq: Every day | ORAL | Status: DC
  Administered 2015-04-13 – 2015-04-14 (×2): 1000 [IU] via ORAL
  Filled 2015-04-13 (×5): qty 1

## 2015-04-13 MED ORDER — FENTANYL CITRATE (PF) 100 MCG/2ML IJ SOLN
INTRAMUSCULAR | Status: DC | PRN
  Administered 2015-04-13: 25 ug via INTRAVENOUS
  Administered 2015-04-13: 50 ug via INTRAVENOUS
  Administered 2015-04-13: 100 ug via INTRAVENOUS
  Administered 2015-04-13: 50 ug via INTRAVENOUS
  Administered 2015-04-13: 25 ug via INTRAVENOUS

## 2015-04-13 SURGICAL SUPPLY — 36 items
APPLIER CLIP 5 13 M/L LIGAMAX5 (MISCELLANEOUS) ×3
CANISTER SUCT 1200ML W/VALVE (MISCELLANEOUS) ×3 IMPLANT
CATH CHOLANGI 4FR 420404F (CATHETERS) IMPLANT
CHLORAPREP W/TINT 26ML (MISCELLANEOUS) ×3 IMPLANT
CLIP APPLIE 5 13 M/L LIGAMAX5 (MISCELLANEOUS) ×1 IMPLANT
CONRAY 60ML FOR OR (MISCELLANEOUS) IMPLANT
DEFOGGER SCOPE WARMER CLEARIFY (MISCELLANEOUS) ×3 IMPLANT
DRSG TEGADERM 2-3/8X2-3/4 SM (GAUZE/BANDAGES/DRESSINGS) IMPLANT
DRSG TELFA 3X8 NADH (GAUZE/BANDAGES/DRESSINGS) IMPLANT
ELECT E-Z MONOPOLAR 33 (MISCELLANEOUS) ×3
ELECTRODE E-Z MONOPOLAR 33 (MISCELLANEOUS) ×1 IMPLANT
ENDOPOUCH RETRIEVER 10 (MISCELLANEOUS) ×3 IMPLANT
GLOVE PI ORTHOPRO 6.5 (GLOVE) ×12
GLOVE PI ORTHOPRO STRL 6.5 (GLOVE) ×6 IMPLANT
GOWN STRL REUS W/ TWL LRG LVL3 (GOWN DISPOSABLE) ×3 IMPLANT
GOWN STRL REUS W/TWL LRG LVL3 (GOWN DISPOSABLE) ×6
IRRIGATION STRYKERFLOW (MISCELLANEOUS) ×1 IMPLANT
IRRIGATOR STRYKERFLOW (MISCELLANEOUS) ×3
IV CATH ANGIO 12GX3 LT BLUE (NEEDLE) IMPLANT
IV NS 1000ML (IV SOLUTION)
IV NS 1000ML BAXH (IV SOLUTION) IMPLANT
LABEL OR SOLS (LABEL) ×3 IMPLANT
LIQUID BAND (GAUZE/BANDAGES/DRESSINGS) ×3 IMPLANT
NEEDLE HYPO 25X1 1.5 SAFETY (NEEDLE) ×3 IMPLANT
NS IRRIG 500ML POUR BTL (IV SOLUTION) ×3 IMPLANT
PACK LAP CHOLECYSTECTOMY (MISCELLANEOUS) ×3 IMPLANT
PAD GROUND ADULT SPLIT (MISCELLANEOUS) ×3 IMPLANT
SCISSORS METZENBAUM CVD 33 (INSTRUMENTS) ×3 IMPLANT
SLEEVE ENDOPATH XCEL 5M (ENDOMECHANICALS) ×6 IMPLANT
SUT MNCRL 4-0 (SUTURE) ×2
SUT MNCRL 4-0 27XMFL (SUTURE) ×1
SUT VICRYL 0 AB UR-6 (SUTURE) ×3 IMPLANT
SUTURE MNCRL 4-0 27XMF (SUTURE) ×1 IMPLANT
TROCAR XCEL BLUNT TIP 100MML (ENDOMECHANICALS) ×3 IMPLANT
TROCAR XCEL NON-BLD 5MMX100MML (ENDOMECHANICALS) ×3 IMPLANT
TUBING INSUFFLATOR HI FLOW (MISCELLANEOUS) ×3 IMPLANT

## 2015-04-13 NOTE — Op Note (Signed)
Laparoscopic Cholecystectomy Procedure Note  Indications: This patient presents with symptomatic gallbladder disease and will undergo laparoscopic cholecystectomy.  Pre-operative Diagnosis: Calculus of gallbladder with acute cholecystitis, without mention of obstruction  Post-operative Diagnosis: Same  Surgeon: Gladis Riffleatherine L Marrio Scribner   Assistants: none  Anesthesia: General endotracheal anesthesia  ASA Class: 2  Procedure Details  The patient was seen again in the Holding Room. The risks, benefits, complications, treatment options, and expected outcomes were discussed with the patient. The possibilities of reaction to medication, pulmonary aspiration, perforation of viscus, bleeding, recurrent infection, finding a normal gallbladder, the need for additional procedures, failure to diagnose a condition, the possible need to convert to an open procedure, and creating a complication requiring transfusion or operation were discussed with the patient. The patient and/or family concurred with the proposed plan, giving informed consent.  The patient was taken to Operating Room, identified as Cindy Neal and the procedure verified as Laparoscopic Cholecystectomy. A Time Out was held and the above information confirmed.  Prior to the induction of general anesthesia, antibiotic prophylaxis was administered. General endotracheal anesthesia was then administered and tolerated well. After the induction, the abdomen was prepped in the usual sterile fashion. The patient was positioned in the supine position with the left arm comfortably tucked, along with some reverse Trendelenburg.  Local anesthetic agent was injected into the skin near the umbilicus and an incision made. The midline fascia was incised and the Hasson technique was used to introduce a 10 mm port under direct vision. It was secured with two figure of eight Vicryl suture placed in the usual fashion. Pneumoperitoneum was then created with CO2 and  tolerated well without any adverse changes in the patient's vital signs. Additional trocars were introduced under direct vision. All skin incisions were infiltrated with a local anesthetic agent before making the incision and placing the trocars.   The gallbladder was identified, the fundus grasped and retracted cephalad. Adhesions were lysed bluntly and with the electrocautery where indicated, taking care not to injure any adjacent organs or viscus. The infundibulum was grasped and retracted laterally, exposing the peritoneum overlying the triangle of Calot. This was then divided and exposed in a blunt fashion. The cystic duct was clearly identified and bluntly dissected circumferentially. The junctions of the gallbladder, cystic duct and common bile duct were clearly identified prior to the division of any linear structure.   The cystic duct was then doubly ligated with surgical clips on the patient side and singly clipped on the gallbladder side and divided. The cystic artery was identified, dissected free, ligated with clips and divided as well.   The gallbladder was dissected from the liver bed in retrograde fashion with the electrocautery. The gallbladder was removed. The liver bed was irrigated and inspected. Hemostasis was achieved with the electrocautery. Copious irrigation was utilized and was repeatedly aspirated until clear all particulate matter.  Pneumoperitoneum was completely reduced after viewing removal of the trocars under direct vision. The wound was thoroughly irrigated and the fascia was then closed with a figure of eight suture; the skin was then closed with 4-0 Monocryl and a sterile glue was applied.  Instrument, sponge, and needle counts were correct at closure and at the conclusion of the case.   Findings: Cholecystitis with Cholelithiasis  Estimated Blood Loss: less than 100 mL         Drains: none         Total IV Fluids: 1000mL         Specimens:  Gallbladder            Complications: None; patient tolerated the procedure well.         Disposition: PACU - hemodynamically stable.         Condition: stable

## 2015-04-13 NOTE — Anesthesia Preprocedure Evaluation (Signed)
Anesthesia Evaluation  Patient identified by MRN, date of birth, ID band Patient awake    Reviewed: Allergy & Precautions, H&P , NPO status , Patient's Chart, lab work & pertinent test results, reviewed documented beta blocker date and time   History of Anesthesia Complications Negative for: history of anesthetic complications  Airway Mallampati: III  TM Distance: >3 FB Neck ROM: full    Dental no notable dental hx. (+) Teeth Intact   Pulmonary neg pulmonary ROS, former smoker,    Pulmonary exam normal breath sounds clear to auscultation       Cardiovascular Exercise Tolerance: Good negative cardio ROS Normal cardiovascular exam Rhythm:regular Rate:Normal     Neuro/Psych PSYCHIATRIC DISORDERS (depression and anxiety) negative neurological ROS     GI/Hepatic Neg liver ROS, GERD  Medicated and Controlled,  Endo/Other  neg diabetesHypothyroidism   Renal/GU negative Renal ROS  negative genitourinary   Musculoskeletal   Abdominal   Peds  Hematology negative hematology ROS (+)   Anesthesia Other Findings Past Medical History:   Anxiety                                                      Osteoporosis                                                 GERD (gastroesophageal reflux disease)                       Hypothyroidism                                               Depression                                                   Hyperlipidemia                                               Reproductive/Obstetrics negative OB ROS                             Anesthesia Physical Anesthesia Plan  ASA: II  Anesthesia Plan: General   Post-op Pain Management:    Induction:   Airway Management Planned:   Additional Equipment:   Intra-op Plan:   Post-operative Plan:   Informed Consent: I have reviewed the patients History and Physical, chart, labs and discussed the procedure including  the risks, benefits and alternatives for the proposed anesthesia with the patient or authorized representative who has indicated his/her understanding and acceptance.   Dental Advisory Given  Plan Discussed with: Anesthesiologist, CRNA and Surgeon  Anesthesia Plan Comments:         Anesthesia Quick Evaluation

## 2015-04-13 NOTE — Transfer of Care (Signed)
Immediate Anesthesia Transfer of Care Note  Patient: Cindy Neal  Procedure(s) Performed: Procedure(s): LAPAROSCOPIC CHOLECYSTECTOMY (N/A)  Patient Location: PACU  Anesthesia Type:General  Level of Consciousness: sedated  Airway & Oxygen Therapy: Patient Spontanous Breathing and Patient connected to face mask oxygen  Post-op Assessment: Report given to RN and Post -op Vital signs reviewed and stable  Post vital signs: Reviewed and stable  Last Vitals:  Filed Vitals:   04/13/15 0449 04/13/15 1444  BP: 127/70 122/74  Pulse: 81 100  Temp: 36.9 C 37.2 C  Resp: 18 16    Complications: No apparent anesthesia complications

## 2015-04-13 NOTE — Anesthesia Postprocedure Evaluation (Signed)
Anesthesia Post Note  Patient: Cindy Neal  Procedure(s) Performed: Procedure(s) (LRB): LAPAROSCOPIC CHOLECYSTECTOMY (N/A)  Patient location during evaluation: PACU Anesthesia Type: General Level of consciousness: awake and alert Pain management: pain level controlled Vital Signs Assessment: post-procedure vital signs reviewed and stable Respiratory status: spontaneous breathing, nonlabored ventilation, respiratory function stable and patient connected to nasal cannula oxygen Cardiovascular status: blood pressure returned to baseline and stable Postop Assessment: no signs of nausea or vomiting Anesthetic complications: no    Last Vitals:  Filed Vitals:   04/13/15 1516 04/13/15 1517  BP: 120/83   Pulse: 86 87  Temp:    Resp: 16 18    Last Pain:  Filed Vitals:   04/13/15 1531  PainSc: 0-No pain                 Lenard SimmerAndrew Conall Vangorder

## 2015-04-13 NOTE — Progress Notes (Deleted)
Laparoscopic Cholecystectomy Procedure Note  Indications: This patient presents with symptomatic gallbladder disease and will undergo laparoscopic cholecystectomy.  Pre-operative Diagnosis: Calculus of gallbladder with acute cholecystitis, without mention of obstruction  Post-operative Diagnosis: Same  Surgeon: Gladis Riffleatherine L Reka Wist   Assistants: none  Anesthesia: General endotracheal anesthesia  ASA Class: 2  Procedure Details  The patient was seen again in the Holding Room. The risks, benefits, complications, treatment options, and expected outcomes were discussed with the patient. The possibilities of reaction to medication, pulmonary aspiration, perforation of viscus, bleeding, recurrent infection, finding a normal gallbladder, the need for additional procedures, failure to diagnose a condition, the possible need to convert to an open procedure, and creating a complication requiring transfusion or operation were discussed with the patient. The patient and/or family concurred with the proposed plan, giving informed consent.  The patient was taken to Operating Room, identified as Cindy Neal and the procedure verified as Laparoscopic Cholecystectomy. A Time Out was held and the above information confirmed.  Prior to the induction of general anesthesia, antibiotic prophylaxis was administered. General endotracheal anesthesia was then administered and tolerated well. After the induction, the abdomen was prepped in the usual sterile fashion. The patient was positioned in the supine position with the left arm comfortably tucked, along with some reverse Trendelenburg.  Local anesthetic agent was injected into the skin near the umbilicus and an incision made. The midline fascia was incised and the Hasson technique was used to introduce a 10 mm port under direct vision. It was secured with two figure of eight Vicryl suture placed in the usual fashion. Pneumoperitoneum was then created with CO2 and  tolerated well without any adverse changes in the patient's vital signs. Additional trocars were introduced under direct vision. All skin incisions were infiltrated with a local anesthetic agent before making the incision and placing the trocars.   The gallbladder was identified, the fundus grasped and retracted cephalad. Adhesions were lysed bluntly and with the electrocautery where indicated, taking care not to injure any adjacent organs or viscus. The infundibulum was grasped and retracted laterally, exposing the peritoneum overlying the triangle of Calot. This was then divided and exposed in a blunt fashion. The cystic duct was clearly identified and bluntly dissected circumferentially. The junctions of the gallbladder, cystic duct and common bile duct were clearly identified prior to the division of any linear structure.   The cystic duct was then doubly ligated with surgical clips on the patient side and singly clipped on the gallbladder side and divided. The cystic artery was identified, dissected free, ligated with clips and divided as well.   The gallbladder was dissected from the liver bed in retrograde fashion with the electrocautery. The gallbladder was removed. The liver bed was irrigated and inspected. Hemostasis was achieved with the electrocautery. Copious irrigation was utilized and was repeatedly aspirated until clear all particulate matter.  Pneumoperitoneum was completely reduced after viewing removal of the trocars under direct vision. The wound was thoroughly irrigated and the fascia was then closed with a figure of eight suture; the skin was then closed with 4-0 Monocryl and a sterile glue was applied.  Instrument, sponge, and needle counts were correct at closure and at the conclusion of the case.   Findings: Cholecystitis with Cholelithiasis  Estimated Blood Loss: less than 100 mL         Drains: none         Total IV Fluids: 1000mL         Specimens:  Gallbladder            Complications: None; patient tolerated the procedure well.         Disposition: PACU - hemodynamically stable.         Condition: stable  

## 2015-04-13 NOTE — Anesthesia Procedure Notes (Signed)
Procedure Name: Intubation Date/Time: 04/13/2015 12:50 PM Performed by: Stormy FabianURTIS, Andrey Mccaskill Pre-anesthesia Checklist: Patient identified, Patient being monitored, Timeout performed, Emergency Drugs available and Suction available Patient Re-evaluated:Patient Re-evaluated prior to inductionOxygen Delivery Method: Circle system utilized Preoxygenation: Pre-oxygenation with 100% oxygen Intubation Type: IV induction Ventilation: Mask ventilation without difficulty Laryngoscope Size: Mac and 3 Grade View: Grade I Tube type: Oral Tube size: 7.0 mm Number of attempts: 1 Airway Equipment and Method: Stylet Placement Confirmation: ETT inserted through vocal cords under direct vision,  positive ETCO2 and breath sounds checked- equal and bilateral Secured at: 21 cm Tube secured with: Tape Dental Injury: Teeth and Oropharynx as per pre-operative assessment

## 2015-04-14 ENCOUNTER — Encounter: Payer: Self-pay | Admitting: Surgery

## 2015-04-14 MED ORDER — OXYCODONE-ACETAMINOPHEN 5-325 MG PO TABS: 1.0000 | ORAL_TABLET | ORAL | Status: DC | PRN

## 2015-04-14 NOTE — Discharge Instructions (Signed)
Cholecystostomy, Care After °Refer to this sheet in the next few weeks. These instructions provide you with information about caring for yourself after your procedure. Your health care provider may also give you more specific instructions. Your treatment has been planned according to current medical practices, but problems sometimes occur. Call your health care provider if you have any problems or questions after your procedure. °WHAT TO EXPECT AFTER THE PROCEDURE °After your procedure, it is common to have soreness near the site of your drainage tube (catheter) or your incision. °HOME CARE INSTRUCTIONS °Incision Care °· Follow instructions from your health care provider about how to take care of your incision. Make sure you: °¨ Wash your hands with soap and water before you change your bandage (dressing). If soap and water are not available, use hand sanitizer. °¨ Change your dressing as told by your health care provider. °¨ Leave stitches (sutures), skin glue, or adhesive strips in place. These skin closures may need to be in place for 2 weeks or longer. If adhesive strip edges start to loosen and curl up, you may trim the loose edges. Do not remove adhesive strips completely unless your health care provider tells you to do that. °· Check your incision and your drainage site every day for signs of infection. Watch for: °¨ Redness, swelling, or pain. °¨ Fluid, blood, or pus. ° General Instructions °· If you were sent home with a surgical drain in place, follow instructions from your health care provider about how to care for your drain and collection bag at home. °· Do not take baths, swim, or use a hot tub until your health care provider approves. Ask your health care provider if you can take showers. You may only be allowed to take sponge baths for bathing. °· Follow instructions from your health care provider about what you may eat or drink. °· Take over-the-counter and prescription medicines only as told by  your health care provider. °· Keep all follow-up visits as told by your health care provider. This is important. °SEEK MEDICAL CARE IF: °· You have redness, swelling, or pain at your incision or drainage site. °· You have nausea or vomiting. °SEEK IMMEDIATE MEDICAL CARE IF: °· Your abdominal pain gets worse. °· You feel dizzy or you faint while standing. °· You have fluid, blood, or pus coming from your incision or drainage site. °· You have a fever. °· You have shortness of breath. °· You have a rapid heartbeat. °· Your nausea or vomiting does not go away. °· Your drainage tube becomes blocked. °· Your drainage tube comes out of your abdomen. °  °This information is not intended to replace advice given to you by your health care provider. Make sure you discuss any questions you have with your health care provider. °  °Document Released: 12/19/2014 Document Reviewed: 07/11/2014 °Elsevier Interactive Patient Education ©2016 [employer] ° °

## 2015-04-14 NOTE — Discharge Planning (Signed)
Pt IV X2 removed. Pt DC papers given, explained and educated.  Explained surgical site care using teach-back and s/sx that may require a f/u call to Dr. Catalina Pizzaold of suggested FU appts and given paper script for PRN pain.  Pt also given pain meds at 1330 just before DC and informed to not take next dose till after 1730.  RN assessment and VS revealed stability for DC to home.  Pt will be wheeled to front and family is transporting to home via car.

## 2015-04-14 NOTE — Discharge Summary (Signed)
Physician Discharge Summary  Patient ID: Cindy Neal MRN: [id number] DOB/AGE: 61/07/1955 61 y.o.  Admit date: 04/12/2015 Discharge date: 04/14/2015  Admission Diagnoses:Acute cholecystitis   Discharge Diagnoses:  Principal Problem:   Acute calculous cholecystitis   Discharged Condition: good  Hospital Course: 60 yr old with acute cholecystitis.  Patient did well after Lap chole, up and walking around, tolerating diet, pain controlled with PO meds  Consults: None  Significant Diagnostic Studies:   Treatments: surgery: Lap Chole 04/13/15  Discharge Exam: Blood pressure 116/66, pulse 72, temperature 97.4 F (36.3 C), temperature source Oral, resp. rate 17, height 5\' 6"  (1.676 m), weight 163 lb (73.936 kg), SpO2 96 %. General appearance: alert, cooperative and no distress GI: soft, approprately tender, incisions c/d/i Extremities: extremities normal, atraumatic, no cyanosis or edema  Disposition: 01-Home or Self Care  Discharge Instructions    Call MD for:  difficulty breathing, headache or visual disturbances    Complete by:  As directed      Call MD for:  persistant nausea and vomiting    Complete by:  As directed      Call MD for:  redness, tenderness, or signs of infection (pain, swelling, redness, odor or green/yellow discharge around incision site)    Complete by:  As directed      Call MD for:  severe uncontrolled pain    Complete by:  As directed      Call MD for:  temperature >100.4    Complete by:  As directed      Diet general    Complete by:  As directed      Driving Restrictions    Complete by:  As directed   No driving while on prescription pain medication     Increase activity slowly    Complete by:  As directed      Lifting restrictions    Complete by:  As directed   No lifting over 15 lbs for 3 weeks     May shower / Bathe    Complete by:  As directed      May walk up steps    Complete by:  As directed      No wound care    Complete by:  As  directed             Medication List    TAKE these medications        ALPRAZolam 0.25 MG tablet  Commonly known as:  XANAX  Take 0.125-0.25 mg by mouth 2 (two) times daily. Take 1 tablet in the morning and 0.5 tablet at night     ARIPiprazole 10 MG tablet  Commonly known as:  ABILIFY  Take 10 mg by mouth daily. AM     buPROPion 300 MG 24 hr tablet  Commonly known as:  WELLBUTRIN XL  Take 300 mg by mouth daily. AM     cholecalciferol 1000 units tablet  Commonly known as:  VITAMIN D  Take 1,000 Units by mouth daily.     gabapentin 300 MG capsule  Commonly known as:  NEURONTIN  Take 300 mg by mouth 2 (two) times daily.     ibuprofen 200 MG tablet  Commonly known as:  ADVIL,MOTRIN  Take 400 mg by mouth every 4 (four) hours as needed.     levothyroxine 112 MCG tablet  Commonly known as:  SYNTHROID, LEVOTHROID  TAKE 1 TABLET (112 MCG TOTAL) BY MOUTH DAILY BEFORE BREAKFAST.     loratadine 10 MG  tablet  Commonly known as:  CLARITIN  Take by mouth.     mirtazapine 30 MG tablet  Commonly known as:  REMERON  Take 30 mg by mouth at bedtime.     omeprazole 20 MG capsule  Commonly known as:  PRILOSEC  Take 20 mg by mouth daily.     oxyCODONE-acetaminophen 5-325 MG tablet  Commonly known as:  PERCOCET/ROXICET  Take 1-2 tablets by mouth every 4 (four) hours as needed for moderate pain.     raloxifene 60 MG tablet  Commonly known as:  EVISTA  TAKE 1 TABLET BY MOUTH EVERY DAY      ASK your doctor about these medications        meloxicam 7.5 MG tablet  Commonly known as:  MOBIC  Take 7.5 mg by mouth daily as needed.  Ask about: Should I take this medication?           Follow-up Information    Follow up with [facility]. Call on 04/16/2015.   Why:  make appointment for 2-3 weeks    Contact information:   [address] [place] [address] [phone]      Signed: Gladis Riffle 04/14/2015, 12:20 PM

## 2015-04-17 LAB — SURGICAL PATHOLOGY

## 2015-04-29 ENCOUNTER — Encounter: Payer: Self-pay | Admitting: Surgery

## 2015-04-29 ENCOUNTER — Ambulatory Visit (INDEPENDENT_AMBULATORY_CARE_PROVIDER_SITE_OTHER): Payer: 59 | Admitting: Surgery

## 2015-04-29 VITALS — BP 135/85 | HR 91 | Temp 97.8°F | Ht 66.0 in | Wt 163.6 lb

## 2015-04-29 DIAGNOSIS — K8000 Calculus of gallbladder with acute cholecystitis without obstruction: Secondary | ICD-10-CM

## 2015-04-29 NOTE — Patient Instructions (Signed)
Please call with any questions or concerns.

## 2015-04-29 NOTE — Progress Notes (Signed)
60 yr old female s/p Lap chole on 12/31 for acute cholecystitis.  Patient states that she is doing well.  She has returned to a normal routine.  She states that she had some diarrhea after eating peanut butter but states if she avoids greasy foods she has done fine.  She states that energy and appetite are back to normal and she is no longer taking pain medication.   Filed Vitals:   04/29/15 1435  BP: 135/85  Pulse: 91  Temp: 97.8 F (36.6 C)   PE:  Gen: NAD Abd; soft, nt, nd, incision sites healing well, c/d/i, some crusting and old glue around umbilical incision, no signs of infection  A/p: Doing well after Lap chole, discussed that she had chronic inflammation and stones in her gallbladder, instructed to wash around umbilical site to clean up crusting and not to lift anything over 15lbs for another week.  She can call with any questions or concerns.

## 2015-07-09 ENCOUNTER — Encounter: Payer: Self-pay | Admitting: Family Medicine

## 2015-07-09 ENCOUNTER — Ambulatory Visit (INDEPENDENT_AMBULATORY_CARE_PROVIDER_SITE_OTHER): Payer: 59 | Admitting: Family Medicine

## 2015-07-09 VITALS — BP 115/74 | HR 89 | Temp 98.7°F | Ht 64.1 in | Wt 157.0 lb

## 2015-07-09 DIAGNOSIS — E039 Hypothyroidism, unspecified: Secondary | ICD-10-CM

## 2015-07-09 DIAGNOSIS — Z23 Encounter for immunization: Secondary | ICD-10-CM | POA: Diagnosis not present

## 2015-07-09 DIAGNOSIS — E785 Hyperlipidemia, unspecified: Secondary | ICD-10-CM | POA: Diagnosis not present

## 2015-07-09 DIAGNOSIS — K219 Gastro-esophageal reflux disease without esophagitis: Secondary | ICD-10-CM

## 2015-07-09 MED ORDER — LEVOTHYROXINE SODIUM 112 MCG PO TABS: ORAL_TABLET | ORAL | Status: DC

## 2015-07-09 NOTE — Assessment & Plan Note (Signed)
Diet controled 

## 2015-07-09 NOTE — Assessment & Plan Note (Signed)
The current medical regimen is effective;  continue present plan and medications.  

## 2015-07-09 NOTE — Progress Notes (Signed)
   BP 115/74 mmHg  Pulse 89  Temp(Src) 98.7 F (37.1 C)  Ht 5' 4.1" (1.628 m)  Wt 157 lb (71.215 kg)  BMI 26.87 kg/m2  SpO2 97%  LMP  (LMP Unknown)   Subjective:    Patient ID: Cindy Neal, female    DOB: 01/27/1956, 60 y.o.   MRN: [id number]  HPI: Cindy Neal is a 60 y.o. female  No chief complaint on file.  patient's reflux doing well good controlno complaints from medications  Taking Evista without problems no hot flashes or side effects no osteoporosis issues  taking thyroid medications in the morning without issues no thyroid symptoms or signs Relevant past medical, surgical, family and social history reviewed and updated as indicated. Interim medical history since our last visit reviewed. Allergies and medications reviewed and updated.  Review of Systems  Constitutional: Negative.   Respiratory: Negative.   Cardiovascular: Negative.     Per HPI unless specifically indicated above     Objective:    BP 115/74 mmHg  Pulse 89  Temp(Src) 98.7 F (37.1 C)  Ht 5' 4.1" (1.628 m)  Wt 157 lb (71.215 kg)  BMI 26.87 kg/m2  SpO2 97%  LMP  (LMP Unknown)  Wt Readings from Last 3 Encounters:  07/09/15 157 lb (71.215 kg)  04/29/15 163 lb 9.6 oz (74.208 kg)  04/12/15 163 lb (73.936 kg)    Physical Exam  Constitutional: She is oriented to person, place, and time. She appears well-developed and well-nourished. No distress.  HENT:  Head: Normocephalic and atraumatic.  Right Ear: Hearing normal.  Left Ear: Hearing normal.  Nose: Nose normal.  Eyes: Conjunctivae and lids are normal. Right eye exhibits no discharge. Left eye exhibits no discharge. No scleral icterus.  Cardiovascular: Normal rate, regular rhythm and normal heart sounds.   Pulmonary/Chest: Effort normal and breath sounds normal. No respiratory distress.  Musculoskeletal: Normal range of motion.  Neurological: She is alert and oriented to person, place, and time.  Skin: Skin is intact. No rash noted.   Psychiatric: She has a normal mood and affect. Her speech is normal and behavior is normal. Judgment and thought content normal. Cognition and memory are normal.        Assessment & Plan:   Problem List Items Addressed This Visit      Digestive   GERD (gastroesophageal reflux disease)    The current medical regimen is effective;  continue present plan and medications.         Endocrine   Hypothyroid    The current medical regimen is effective;  continue present plan and medications.       Relevant Medications   levothyroxine (SYNTHROID, LEVOTHROID) 112 MCG tablet     Other   Hyperlipidemia    Diet controled       Other Visit Diagnoses    Immunization due    -  Primary    Relevant Orders    Tdap vaccine greater than or equal to 7yo IM (Completed)        Follow up plan: Return in about 6 months (around 01/09/2016) for Physical Exam with CW.

## 2015-08-05 ENCOUNTER — Encounter: Payer: Self-pay | Admitting: Family Medicine

## 2015-08-05 ENCOUNTER — Ambulatory Visit (INDEPENDENT_AMBULATORY_CARE_PROVIDER_SITE_OTHER): Payer: 59 | Admitting: Family Medicine

## 2015-08-05 VITALS — BP 124/85 | HR 78 | Temp 97.7°F | Ht 64.8 in | Wt 162.0 lb

## 2015-08-05 DIAGNOSIS — F411 Generalized anxiety disorder: Secondary | ICD-10-CM | POA: Diagnosis not present

## 2015-08-05 MED ORDER — HYDROXYZINE HCL 25 MG PO TABS: 25.0000 mg | ORAL_TABLET | Freq: Three times a day (TID) | ORAL | Status: DC | PRN

## 2015-08-05 NOTE — Assessment & Plan Note (Signed)
Discuss anxiety care and treatment continued use of depression medications will give hydroxyzine discuss appropriate use of medications

## 2015-08-05 NOTE — Progress Notes (Signed)
BP 124/85 mmHg  Pulse 78  Temp(Src) 97.7 F (36.5 C)  Ht 5' 4.8" (1.646 m)  Wt 162 lb (73.483 kg)  BMI 27.12 kg/m2  SpO2 96%  LMP  (LMP Unknown)   Subjective:    Patient ID: Cindy Neal, female    DOB: 02/11/1956, 60 y.o.   MRN: [id number]  HPI: Cindy Neal is a 60 y.o. female  Chief Complaint  Patient presents with  . Insomnia  . Anxiety   Patient under a great deal of stress with loss of her husband, moving and work stress. Agents tried trazodone which helped at one time and doesn't help with sleep anymore also with Remeron no help with sleep EpiPen hadn't done much helps a little bit for her back. For nerves takes Abilify and bupropion has been on both of those a long time As been getting those from [facility] and the doctor change all the time is looking to get involved with a therapist and psychiatrist here in the [place]  Relevant past medical, surgical, family and social history reviewed and updated as indicated. Interim medical history since our last visit reviewed. Allergies and medications reviewed and updated.  Review of Systems  Constitutional: Negative.   Respiratory: Negative.   Cardiovascular: Negative.     Per HPI unless specifically indicated above     Objective:    BP 124/85 mmHg  Pulse 78  Temp(Src) 97.7 F (36.5 C)  Ht 5' 4.8" (1.646 m)  Wt 162 lb (73.483 kg)  BMI 27.12 kg/m2  SpO2 96%  LMP  (LMP Unknown)  Wt Readings from Last 3 Encounters:  08/05/15 162 lb (73.483 kg)  07/09/15 157 lb (71.215 kg)  04/29/15 163 lb 9.6 oz (74.208 kg)    Physical Exam  Constitutional: She is oriented to person, place, and time. She appears well-developed and well-nourished. No distress.  HENT:  Head: Normocephalic and atraumatic.  Right Ear: Hearing normal.  Left Ear: Hearing normal.  Nose: Nose normal.  Eyes: Conjunctivae and lids are normal. Right eye exhibits no discharge. Left eye exhibits no discharge. No scleral icterus.  Cardiovascular:  Normal rate, regular rhythm and normal heart sounds.   Pulmonary/Chest: Effort normal and breath sounds normal. No respiratory distress.  Musculoskeletal: Normal range of motion.  Neurological: She is alert and oriented to person, place, and time.  Skin: Skin is intact. No rash noted.  Psychiatric: She has a normal mood and affect. Her speech is normal and behavior is normal. Judgment and thought content normal. Cognition and memory are normal.    Results for orders placed or performed during the hospital encounter of 04/12/15  MRSA PCR Screening  Result Value Ref Range   MRSA by PCR NEGATIVE NEGATIVE  CBC with Differential  Result Value Ref Range   WBC 12.3 (H) 3.6 - 11.0 K/uL   RBC 4.63 3.80 - 5.20 MIL/uL   Hemoglobin 13.4 12.0 - 16.0 g/dL   HCT 40.9 81.1 - 91.4 %   MCV 87.8 80.0 - 100.0 fL   MCH 29.0 26.0 - 34.0 pg   MCHC 33.1 32.0 - 36.0 g/dL   RDW 78.2 95.6 - 21.3 %   Platelets 265 150 - 440 K/uL   Neutrophils Relative % 75 %   Neutro Abs 9.4 (H) 1.4 - 6.5 K/uL   Lymphocytes Relative 14 %   Lymphs Abs 1.7 1.0 - 3.6 K/uL   Monocytes Relative 9 %   Monocytes Absolute 1.1 (H) 0.2 - 0.9 K/uL  Eosinophils Relative 1 %   Eosinophils Absolute 0.1 0 - 0.7 K/uL   Basophils Relative 1 %   Basophils Absolute 0.1 0 - 0.1 K/uL  Comprehensive metabolic panel  Result Value Ref Range   Sodium 139 135 - 145 mmol/L   Potassium 3.5 3.5 - 5.1 mmol/L   Chloride 103 101 - 111 mmol/L   CO2 26 22 - 32 mmol/L   Glucose, Bld 103 (H) 65 - 99 mg/dL   BUN 9 6 - 20 mg/dL   Creatinine, Ser 0.980.74 0.44 - 1.00 mg/dL   Calcium 9.8 8.9 - 11.910.3 mg/dL   Total Protein 7.3 6.5 - 8.1 g/dL   Albumin 4.0 3.5 - 5.0 g/dL   AST 15 15 - 41 U/L   ALT 20 14 - 54 U/L   Alkaline Phosphatase 86 38 - 126 U/L   Total Bilirubin 0.9 0.3 - 1.2 mg/dL   GFR calc non Af Amer >60 >60 mL/min   GFR calc Af Amer >60 >60 mL/min   Anion gap 10 5 - 15  Lipase, blood  Result Value Ref Range   Lipase 15 11 - 51 U/L   Urinalysis complete, with microscopic  Result Value Ref Range   Color, Urine YELLOW (A) YELLOW   APPearance CLOUDY (A) CLEAR   Glucose, UA NEGATIVE NEGATIVE mg/dL   Bilirubin Urine NEGATIVE NEGATIVE   Ketones, ur NEGATIVE NEGATIVE mg/dL   Specific Gravity, Urine 1.005 1.005 - 1.030   Hgb urine dipstick 1+ (A) NEGATIVE   pH 5.0 5.0 - 8.0   Protein, ur NEGATIVE NEGATIVE mg/dL   Nitrite NEGATIVE NEGATIVE   Leukocytes, UA 3+ (A) NEGATIVE   RBC / HPF 0-5 0 - 5 RBC/hpf   WBC, UA TOO NUMEROUS TO COUNT 0 - 5 WBC/hpf   Bacteria, UA MANY (A) NONE SEEN   Squamous Epithelial / LPF 0-5 (A) NONE SEEN   WBC Clumps PRESENT    Mucous PRESENT   Surgical pathology  Result Value Ref Range   SURGICAL PATHOLOGY      Surgical Pathology CASE: [id number] PATIENT: Cindy Neal Surgical Pathology Report     SPECIMEN SUBMITTED: A. Gallbladder  CLINICAL HISTORY: Cholecystitis; Not provided  PRE-OPERATIVE DIAGNOSIS:   POST-OPERATIVE DIAGNOSIS:      DIAGNOSIS: A. GALLBLADDER; CHOLECYSTECTOMY: - ACUTE CHOLECYSTITIS AND CHOLELITHIASIS. - NEGATIVE FOR DYSPLASIA AND MALIGNANCY.  GROSS DESCRIPTION: A. Labeled: Gallbladder  Size of specimen: 10.5 x 3.5 x 2 cm  Previously opened: Yes  External surface: Pink-tan and somewhat ragged  Wall thickness: 0.2-0.6 cm  Mucosa: Dark red eroded and granular  Stones present: Yes, 1 single green yellow calculus measuring 2.1 cm in diameter  Other findings: None  Block summary: 1 - representative sections  Final Diagnosis performed by Glenice Bowana Baker, MD.  Electronically signed 04/17/2015 12:03:41PM    The electronic signature indicates that the named Attending Pathologist has evaluated the specimen  Technical component perf ormed at [facility], [address], [place], [place] [address] Lab: [phone] Dir: Titus DubinWilliam F. Cato MulliganHancock, MD  Professional component performed at [facility], [facility], [address],  [place], [place] [address] Lab: [phone] Dir: Georgiann Cockerara C. Oneita Krasubinas, MD        Assessment & Plan:   Problem List Items Addressed This Visit      Other   Anxiety state - Primary    Discuss anxiety care and treatment continued use of depression medications will give hydroxyzine discuss appropriate use of medications      Relevant Medications  hydrOXYzine (ATARAX/VISTARIL) 25 MG tablet     Patient list of psychiatrists here in the [place]  Follow up plan: Return in about 4 weeks (around 09/02/2015).

## 2015-08-13 ENCOUNTER — Telehealth: Payer: Self-pay | Admitting: Family Medicine

## 2015-08-13 MED ORDER — LORAZEPAM 1 MG PO TABS: 0.5000 mg | ORAL_TABLET | Freq: Every day | ORAL | Status: DC | PRN

## 2015-08-13 NOTE — Telephone Encounter (Signed)
Pt called stated Dr. Dossie Arbourrissman gave her a RX for an antihistamine (pt's exact word) but she still is not able to sleep and she has high anxiety, wants to know if something else can be called in for her as the medication he gave her is not working. Pharm [facility] [place]. Thanks.

## 2015-08-13 NOTE — Telephone Encounter (Signed)
Phone call Discussed with patient hydroxyzine not help and still not sleeping having a great deal of anxiety wants to try something else for sleep and anxiety.

## 2015-08-13 NOTE — Telephone Encounter (Signed)
Call pt 

## 2015-10-03 ENCOUNTER — Encounter: Payer: Self-pay | Admitting: Family Medicine

## 2015-10-03 ENCOUNTER — Ambulatory Visit (INDEPENDENT_AMBULATORY_CARE_PROVIDER_SITE_OTHER): Payer: 59 | Admitting: Family Medicine

## 2015-10-03 VITALS — BP 131/82 | HR 72 | Temp 97.8°F | Ht 64.1 in | Wt 162.0 lb

## 2015-10-03 DIAGNOSIS — F411 Generalized anxiety disorder: Secondary | ICD-10-CM

## 2015-10-03 DIAGNOSIS — F3340 Major depressive disorder, recurrent, in remission, unspecified: Secondary | ICD-10-CM | POA: Diagnosis not present

## 2015-10-03 MED ORDER — LORAZEPAM 1 MG PO TABS: 0.5000 mg | ORAL_TABLET | Freq: Every day | ORAL | Status: DC | PRN

## 2015-10-03 NOTE — Assessment & Plan Note (Signed)
anxiety care and treatment cautions with lorazepam but will continue Patient getting appointment with [facility] to further evaluate this may take a while

## 2015-10-03 NOTE — Assessment & Plan Note (Signed)
The current medical regimen is effective;  continue present plan and medications.  

## 2015-10-03 NOTE — Progress Notes (Signed)
   BP 131/82 mmHg  Pulse 72  Temp(Src) 97.8 F (36.6 C)  Ht 5' 4.1" (1.628 m)  Wt 162 lb (73.483 kg)  BMI 27.73 kg/m2  SpO2 96%  LMP  (LMP Unknown)   Subjective:    Patient ID: Cindy Neal, female    DOB: 02/19/1956, 60 y.o.   MRN: [id number]  HPI: Cindy Neal is a 60 y.o. female  Chief Complaint  Patient presents with  . Anxiety  . Insomnia  Scheduled with patient tried trazodone Remeron and hydroxyzine with multiple side effects and poor effect. Patient was started on lorazepam and is sleeping better and anxiety is much better. Patient has moved in gotten through a great deal of stress. Now sleeping better as noted above  Relevant past medical, surgical, family and social history reviewed and updated as indicated. Interim medical history since our last visit reviewed. Allergies and medications reviewed and updated.  Review of Systems  Constitutional: Negative.   Respiratory: Negative.   Cardiovascular: Negative.     Per HPI unless specifically indicated above     Objective:    BP 131/82 mmHg  Pulse 72  Temp(Src) 97.8 F (36.6 C)  Ht 5' 4.1" (1.628 m)  Wt 162 lb (73.483 kg)  BMI 27.73 kg/m2  SpO2 96%  LMP  (LMP Unknown)  Wt Readings from Last 3 Encounters:  10/03/15 162 lb (73.483 kg)  08/05/15 162 lb (73.483 kg)  07/09/15 157 lb (71.215 kg)    Physical Exam  Constitutional: She is oriented to person, place, and time. She appears well-developed and well-nourished. No distress.  HENT:  Head: Normocephalic and atraumatic.  Right Ear: Hearing normal.  Left Ear: Hearing normal.  Nose: Nose normal.  Eyes: Conjunctivae and lids are normal. Right eye exhibits no discharge. Left eye exhibits no discharge. No scleral icterus.  Cardiovascular: Normal rate, regular rhythm and normal heart sounds.   Pulmonary/Chest: Effort normal and breath sounds normal. No respiratory distress.  Musculoskeletal: Normal range of motion.  Neurological: She is alert and  oriented to person, place, and time.  Skin: Skin is intact. No rash noted.  Psychiatric: She has a normal mood and affect. Her speech is normal and behavior is normal. Judgment and thought content normal. Cognition and memory are normal.        Assessment & Plan:   Problem List Items Addressed This Visit      Other   Anxiety state - Primary     anxiety care and treatment cautions with lorazepam but will continue Patient getting appointment with [facility] to further evaluate this may take a while      Relevant Medications   LORazepam (ATIVAN) 1 MG tablet   Major depressive disorder (HCC)    The current medical regimen is effective;  continue present plan and medications.       Relevant Medications   LORazepam (ATIVAN) 1 MG tablet       Follow up plan: Return in about 3 months (around 01/03/2016) for Physical Exam.

## 2015-10-23 ENCOUNTER — Ambulatory Visit (INDEPENDENT_AMBULATORY_CARE_PROVIDER_SITE_OTHER): Payer: 59 | Admitting: Psychiatry

## 2015-10-23 DIAGNOSIS — F39 Unspecified mood [affective] disorder: Secondary | ICD-10-CM

## 2015-10-23 MED ORDER — LORAZEPAM 1 MG PO TABS: 0.5000 mg | ORAL_TABLET | Freq: Every day | ORAL | Status: DC

## 2015-10-23 MED ORDER — BUPROPION HCL 75 MG PO TABS: 75.0000 mg | ORAL_TABLET | Freq: Every day | ORAL | Status: DC

## 2015-10-23 MED ORDER — MIRTAZAPINE 15 MG PO TABS: 15.0000 mg | ORAL_TABLET | Freq: Every day | ORAL | Status: DC

## 2015-10-23 NOTE — Progress Notes (Signed)
Psychiatric Initial Adult Assessment   Patient Identification: Cindy Neal MRN:  [id number] Date of Evaluation:  10/23/2015 Referral Source: [facility]  Chief Complaint:   Chief Complaint    Establish Care; Anxiety     Visit Diagnosis:    ICD-9-CM ICD-10-CM   1. Episodic mood disorder (HCC) 296.90 F39     History of Present Illness:    Patient is a 60 year old widowed female who presented for initial assessment. She was referred by [facility] psychiatry clinic. She has history of bilateral cochlear implant and reported that she has been following psychiatry restaurants at [facility]. She reported that she got tired as the restaurants keep on changing and she wants to establish her care. Patient reported that she has been depressed since the death of her husband in Sep 04, 2022. She has increased anxiety and has been tried on medications which have been helpful. However she continues to feel depressed and has increased anxiety. She feels anxious throughout the day. Currently she works at [employer] in [employer]. Her husband passed away due to stroke. Patient reports increased anxiety insomnia but denied having any depressive symptoms. She denied having any suicidal ideations or plans.She  denied having any perceptual disturbances.    Associated Signs/Symptoms: Depression Symptoms:  depressed mood, insomnia, psychomotor retardation, fatigue, feelings of worthlessness/guilt, difficulty concentrating, hopelessness, anxiety, disturbed sleep, (Hypo) Manic Symptoms:  Flight of Ideas, Irritable Mood, Labiality of Mood, Anxiety Symptoms:  Excessive Worry, Psychotic Symptoms:  none PTSD Symptoms: Negative NA  Past Psychiatric History:  1995- [facility]- od on meds Admitted voluntarily in [place] - meds were off.   Previous Psychotropic Medications:  Trazodone neurontin zoloft Lithium- 10 years   Substance Abuse History in the last 12 months:   No.  Consequences of Substance Abuse: Negative NA  Past Medical History:  Past Medical History  Diagnosis Date  . Anxiety   . Osteoporosis   . GERD (gastroesophageal reflux disease)   . Depression   . Hyperlipidemia     Past Surgical History  Procedure Laterality Date  . Cochlear implant Bilateral 10/15, 04/16  . Joint replacement Right     Hip  . Wrist surgery Right   . Minor excision of oral lesion N/A 08/30/2014    Procedure: MINOR EXCISION OF ORAL LESION;  Surgeon: Vernie Murders, MD;  [facility];  Service: ENT;  Laterality: N/A;  HARD PALATE LESION  . Cholecystectomy N/A 04/13/2015    Procedure: LAPAROSCOPIC CHOLECYSTECTOMY;  Surgeon: Gladis Riffle, MD;  Location: [facility];  Service: General;  Laterality: N/A;    Family Psychiatric History: she denied   Family History:  Family History  Problem Relation Age of Onset  . Cancer Mother     lung  . Hypertension Mother   . Hyperlipidemia Mother   . Lung disease Maternal Grandmother   . Emphysema Father   . Thyroid disease Father     Social History:   Social History   Social History  . Marital Status: Married    Spouse Name: N/A  . Number of Children: N/A  . Years of Education: N/A   Social History Main Topics  . Smoking status: Former Smoker    Quit date: 04/13/2006  . Smokeless tobacco: Never Used  . Alcohol Use: No  . Drug Use: No  . Sexual Activity: Yes   Other Topics Concern  . Not on file   Social History Narrative    Additional Social History: husband passed away in  April 2017.  Currently lives by herself. She works in the Music therapist at [employer].   Allergies:  No Known Allergies  Metabolic Disorder Labs: No results found for: HGBA1C, MPG No results found for: PROLACTIN Lab Results  Component Value Date   CHOL 204* 01/08/2015   TRIG 251* 01/08/2015   HDL 53 01/08/2015   LDLCALC 101* 01/08/2015     Current Medications: Current Outpatient Prescriptions   Medication Sig Dispense Refill  . ARIPiprazole (ABILIFY) 10 MG tablet Take 10 mg by mouth daily. AM    . buPROPion (WELLBUTRIN) 75 MG tablet Take 1 tablet (75 mg total) by mouth daily after breakfast. 30 tablet 0  . cholecalciferol (VITAMIN D) 1000 units tablet Take 1,000 Units by mouth daily.    Marland Kitchen ibuprofen (ADVIL,MOTRIN) 200 MG tablet Take 400 mg by mouth every 4 (four) hours as needed.    Marland Kitchen levothyroxine (SYNTHROID, LEVOTHROID) 112 MCG tablet TAKE 1 TABLET (112 MCG TOTAL) BY MOUTH DAILY BEFORE BREAKFAST. 90 tablet 2  . loratadine (CLARITIN) 10 MG tablet Take by mouth.    Marland Kitchen LORazepam (ATIVAN) 1 MG tablet Take 0.5 tablets (0.5 mg total) by mouth at bedtime. For sleep and anxiety 30 tablet 0  . mirtazapine (REMERON) 15 MG tablet Take 1 tablet (15 mg total) by mouth at bedtime. 30 tablet 1  . omeprazole (PRILOSEC) 20 MG capsule Take 20 mg by mouth daily.     . raloxifene (EVISTA) 60 MG tablet TAKE 1 TABLET BY MOUTH EVERY DAY 90 tablet 4  . traZODone (DESYREL) 50 MG tablet Reported on 10/03/2015  1   No current facility-administered medications for this visit.    Neurologic: Headache: No Seizure: No Paresthesias:No  Musculoskeletal: Strength & Muscle Tone: within normal limits Gait & Station: normal Patient leans: N/A  Psychiatric Specialty Exam: Review of Systems  Psychiatric/Behavioral: Positive for depression. The patient is nervous/anxious and has insomnia.   All other systems reviewed and are negative.   There were no vitals taken for this visit.There is no weight on file to calculate BMI.  General Appearance: Casual  Eye Contact:  Fair  Speech:  Clear and Coherent  Volume:  Normal  Mood:  Anxious  Affect:  Congruent  Thought Process:  Coherent  Orientation:  Full (Time, Place, and Person)  Thought Content:  WDL  Suicidal Thoughts:  No  Homicidal Thoughts:  No  Memory:  Immediate;   Fair Recent;   Fair Remote;   Fair  Judgement:  Fair  Insight:  Fair  Psychomotor  Activity:  Normal  Concentration:  Concentration: Fair and Attention Span: Fair  Recall:  [employer] of Knowledge:Fair  Language: Fair  Akathisia:  No  Handed:  Right  AIMS (if indicated):    Assets:  Communication Skills Desire for Improvement Physical Health Social Support  ADL's:  Intact  Cognition: WNL  Sleep:  poor    Treatment Plan Summary: Medication management   Discussed with patient about her medications. I will make an adjustment to her medication as follows  Abilify 10 mg daily Decrease Wellbutrin 75 mg in the morning Decrease lorazepam 0.5 mg at bedtime Decrease the Remeron 15 mg at bedtime Advised patient that if she notices worsening of symptoms she should call for an early appointment  Follow-up in 2 weeks or earlier depending on her symptoms   More than 50% of the time spent in psychoeducation, counseling and coordination of care.    This note was generated in part or whole  with voice recognition software. Voice regonition is usually quite accurate but there are transcription errors that can and very often do occur. I apologize for any typographical errors that were not detected and corrected.    Brandy HaleUzma Karaline Buresh, MD 7/12/20173:56 PM

## 2015-11-07 ENCOUNTER — Ambulatory Visit: Payer: 59 | Admitting: Psychiatry

## 2015-11-12 ENCOUNTER — Ambulatory Visit (INDEPENDENT_AMBULATORY_CARE_PROVIDER_SITE_OTHER): Payer: 59 | Admitting: Psychiatry

## 2015-11-12 ENCOUNTER — Ambulatory Visit (INDEPENDENT_AMBULATORY_CARE_PROVIDER_SITE_OTHER): Payer: 59 | Admitting: Licensed Clinical Social Worker

## 2015-11-12 DIAGNOSIS — F411 Generalized anxiety disorder: Secondary | ICD-10-CM

## 2015-11-12 DIAGNOSIS — F39 Unspecified mood [affective] disorder: Secondary | ICD-10-CM

## 2015-11-12 MED ORDER — LORAZEPAM 0.5 MG PO TABS: 0.5000 mg | ORAL_TABLET | Freq: Every day | ORAL | 1 refills | Status: DC

## 2015-11-12 MED ORDER — ARIPIPRAZOLE 10 MG PO TABS: 10.0000 mg | ORAL_TABLET | Freq: Every day | ORAL | 1 refills | Status: DC

## 2015-11-12 MED ORDER — BUPROPION HCL 75 MG PO TABS: 75.0000 mg | ORAL_TABLET | Freq: Every day | ORAL | 1 refills | Status: DC
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
## 2015-11-12 NOTE — Progress Notes (Signed)
Psychiatric MD Progress Note  Patient Identification: Cindy Neal MRN:  [id number] Date of Evaluation:  11/12/2015 Referral Source: [facility]  Chief Complaint:   Chief Complaint    Medication Refill     Visit Diagnosis:    ICD-9-CM ICD-10-CM   1. Episodic mood disorder (HCC) 296.90 F39   2. Generalized anxiety disorder 300.02 F41.1     History of Present Illness:    Patient is a 60 year old widowed female who presented for follow up. She was referred by [facility] psychiatry clinic. She has history of bilateral cochlear implant and reported that she has been following psychiatry residents  at [facility]. She reported that she got tired as the residents  keep on changing and she wants to establish her care.  Patient wants seen for follow-up. She reported that she has been feeling better and the medication adjustment has helped her. She stated that she is able to feel less depressed and her mood is improving. She currently denied having any side effects of the medication. She reported that she feels somewhat anxious and jittery in the afternoon. She takes lorazepam and Remeron at bedtime to help her sleep. Asian reported that she has been sleeping well at night. She takes Wellbutrin and Abilify in the morning and wakes up energetic. She has noticed improvement in her energy level. She wants to minimize the use of her medication and has already stopped taking the trazodone at this time. She reported that she is worried about her job as she has cochlear implants and she has hearing difficulties. She appears motivated during the interview. We discussed about her medications at length.   She is willing to take half pill of lorazepam during the daytime to decrease her anxiety in the afternoon. She agreed with the plan. She wants to decrease the use of medications.   She  currently denied having any suicidal homicidal ideations or plans.      Associated Signs/Symptoms: Depression  Symptoms:  depressed mood, psychomotor retardation, feelings of worthlessness/guilt, hopelessness, anxiety, disturbed sleep, (Hypo) Manic Symptoms:  Flight of Ideas, Irritable Mood, Labiality of Mood, Anxiety Symptoms:  Excessive Worry, Psychotic Symptoms:  none PTSD Symptoms: Negative NA  Past Psychiatric History:  1995- [facility]- od on meds Admitted voluntarily in  - meds were off.   Previous Psychotropic Medications:  Trazodone neurontin zoloft Lithium- 10 years   Substance Abuse History in the last 12 months:  No.  Consequences of Substance Abuse: Negative NA  Past Medical History:  Past Medical History:  Diagnosis Date  . Anxiety   . Depression   . GERD (gastroesophageal reflux disease)   . Hyperlipidemia   . Osteoporosis     Past Surgical History:  Procedure Laterality Date  . CHOLECYSTECTOMY N/A 04/13/2015   Procedure: LAPAROSCOPIC CHOLECYSTECTOMY;  Surgeon: Gladis Riffle, MD;  Location: [facility] ORS;  Service: General;  Laterality: N/A;  . COCHLEAR IMPLANT Bilateral 10/15, 04/16  . JOINT REPLACEMENT Right    Hip  . MINOR EXCISION OF ORAL LESION N/A 08/30/2014   Procedure: MINOR EXCISION OF ORAL LESION;  Surgeon: Vernie Murders, MD;  Location[facility];  Service: ENT;  Laterality: N/A;  HARD PALATE LESION  . WRIST SURGERY Right     Family Psychiatric History: she denied   Family History:  Family History  Problem Relation Age of Onset  . Cancer Mother     lung  . Hypertension Mother   . Hyperlipidemia Mother   . Lung disease  Maternal Grandmother   . Emphysema Father   . Thyroid disease Father     Social History:   Social History   Social History  . Marital status: Married    Spouse name: N/A  . Number of children: N/A  . Years of education: N/A   Social History Main Topics  . Smoking status: Former Smoker    Quit date: 04/13/2006  . Smokeless tobacco: Never Used  . Alcohol use No  . Drug use: No  . Sexual  activity: Yes   Other Topics Concern  . Not on file   Social History Narrative  . No narrative on file    Additional Social History: husband passed away in Aug 18, 2015.  Currently lives by herself. She works in the Music therapist at [employer].   Allergies:  No Known Allergies  Metabolic Disorder Labs: No results found for: HGBA1C, MPG No results found for: PROLACTIN Lab Results  Component Value Date   CHOL 204 (H) 01/08/2015   TRIG 251 (H) 01/08/2015   HDL 53 01/08/2015   LDLCALC 101 (H) 01/08/2015     Current Medications: Current Outpatient Prescriptions  Medication Sig Dispense Refill  . ARIPiprazole (ABILIFY) 10 MG tablet Take 1 tablet (10 mg total) by mouth daily. AM 30 tablet 1  . buPROPion (WELLBUTRIN) 75 MG tablet Take 1 tablet (75 mg total) by mouth daily after breakfast. 30 tablet 1  . cholecalciferol (VITAMIN D) 1000 units tablet Take 1,000 Units by mouth daily.    Marland Kitchen ibuprofen (ADVIL,MOTRIN) 200 MG tablet Take 400 mg by mouth every 4 (four) hours as needed.    Marland Kitchen levothyroxine (SYNTHROID, LEVOTHROID) 112 MCG tablet TAKE 1 TABLET (112 MCG TOTAL) BY MOUTH DAILY BEFORE BREAKFAST. 90 tablet 2  . loratadine (CLARITIN) 10 MG tablet Take by mouth.    Marland Kitchen LORazepam (ATIVAN) 0.5 MG tablet Take 1 tablet (0.5 mg total) by mouth daily. 30 tablet 1  . mirtazapine (REMERON) 15 MG tablet Take 1 tablet (15 mg total) by mouth at bedtime. 30 tablet 1  . omeprazole (PRILOSEC) 20 MG capsule Take 20 mg by mouth daily.     . raloxifene (EVISTA) 60 MG tablet TAKE 1 TABLET BY MOUTH EVERY DAY 90 tablet 4   No current facility-administered medications for this visit.     Neurologic: Headache: No Seizure: No Paresthesias:No  Musculoskeletal: Strength & Muscle Tone: within normal limits Gait & Station: normal Patient leans: N/A  Psychiatric Specialty Exam: Review of Systems  Psychiatric/Behavioral: Positive for depression. The patient is nervous/anxious and has insomnia.   All other  systems reviewed and are negative.   There were no vitals taken for this visit.There is no height or weight on file to calculate BMI.  General Appearance: Casual  Eye Contact:  Fair  Speech:  Clear and Coherent  Volume:  Normal  Mood:  Anxious  Affect:  Congruent  Thought Process:  Coherent  Orientation:  Full (Time, Place, and Person)  Thought Content:  WDL  Suicidal Thoughts:  No  Homicidal Thoughts:  No  Memory:  Immediate;   Fair Recent;   Fair Remote;   Fair  Judgement:  Fair  Insight:  Fair  Psychomotor Activity:  Normal  Concentration:  Concentration: Fair and Attention Span: Fair  Recall:  [employer] of Knowledge:Fair  Language: Fair  Akathisia:  No  Handed:  Right  AIMS (if indicated):    Assets:  Communication Skills Desire for Improvement Physical Health Social Support  ADL's:  Intact  Cognition: WNL  Sleep:  poor    Treatment Plan Summary: Medication management   Discussed with patient about her medications. I will make an adjustment to her medication as follows  Abilify 10 mg daily Continue  Wellbutrin 75 mg in the morning Decrease lorazepam 0.25mg  at noon and bedtime  Continue  the Remeron 15 mg at bedtime Advised patient that if she notices worsening of symptoms she should call for an early appointment  Follow-up in 4 weeks or earlier depending on her symptoms   More than 50% of the time spent in psychoeducation, counseling and coordination of care.    This note was generated in part or whole with voice recognition software. Voice regonition is usually quite accurate but there are transcription errors that can and very often do occur. I apologize for any typographical errors that were not detected and corrected.    Brandy Hale, MD 8/1/20172:22 PM

## 2015-12-11 ENCOUNTER — Ambulatory Visit (INDEPENDENT_AMBULATORY_CARE_PROVIDER_SITE_OTHER): Payer: 59 | Admitting: Psychiatry

## 2015-12-11 ENCOUNTER — Telehealth: Payer: Self-pay

## 2015-12-11 ENCOUNTER — Encounter: Payer: Self-pay | Admitting: Psychiatry

## 2015-12-11 VITALS — BP 131/85 | HR 87 | Temp 98.0°F | Ht 64.0 in | Wt 169.6 lb

## 2015-12-11 DIAGNOSIS — F411 Generalized anxiety disorder: Secondary | ICD-10-CM

## 2015-12-11 DIAGNOSIS — F39 Unspecified mood [affective] disorder: Secondary | ICD-10-CM | POA: Diagnosis not present

## 2015-12-11 MED ORDER — ARIPIPRAZOLE 10 MG PO TABS: 10.0000 mg | ORAL_TABLET | Freq: Every day | ORAL | 1 refills | Status: DC

## 2015-12-11 MED ORDER — BUPROPION HCL 75 MG PO TABS: 75.0000 mg | ORAL_TABLET | Freq: Every day | ORAL | 1 refills | Status: DC

## 2015-12-11 MED ORDER — MIRTAZAPINE 15 MG PO TABS: 15.0000 mg | ORAL_TABLET | Freq: Every day | ORAL | 1 refills | Status: DC

## 2015-12-11 MED ORDER — LORAZEPAM 0.5 MG PO TABS: 0.5000 mg | ORAL_TABLET | Freq: Every day | ORAL | 1 refills | Status: DC

## 2015-12-11 NOTE — Telephone Encounter (Signed)
called in ok for 90 day supply of abilify.  pt was seen today.  Pt insurance will not cover except for 90 day supply.

## 2015-12-11 NOTE — Telephone Encounter (Signed)
nicole from [facility] called left message that abilify needed to be in a 90 day supply. pt insurance will not cover unless it was a 90 day supply.

## 2015-12-11 NOTE — Progress Notes (Signed)
Psychiatric MD Progress Note  Patient Identification: Cindy Neal MRN:  [id number] Date of Evaluation:  12/11/2015 Referral Source: [facility]  Chief Complaint:   Chief Complaint    Follow-up; Medication Refill     Visit Diagnosis:    ICD-9-CM ICD-10-CM   1. Episodic mood disorder (HCC) 296.90 F39   2. Generalized anxiety disorder 300.02 F41.1     History of Present Illness:    Patient is a 60 year old widowed female who presented for follow up. She was referred by [facility] psychiatry clinic. She has history of bilateral cochlear implant and reported that she has been following psychiatry residents  at [facility]. She reported that she Just returned after spending one week at the beach with her friends. Patient reported that she has been doing well on the current combination of the medications. She has been compliant with her medications. Patient currently denied having any adverse effects to her medications. She appeared calm and alert during the interview. She reported that she is enjoying her time at home. She reported that the medication has really been very helpful and she feels energetic in the day. Occasionally she will have problem with the sleep. She feels that the medications makes her feel energetic. She denied having any side effects of the medications. She has recently filled  her medication and is only running out of the Remeron at this time.  She denied having any suicidal homicidal ideations or plans.        Associated Signs/Symptoms: Depression Symptoms:  depressed mood, anxiety, (Hypo) Manic Symptoms:  Flight of Ideas, Irritable Mood, Labiality of Mood, Anxiety Symptoms:  Excessive Worry, Psychotic Symptoms:  none PTSD Symptoms: Negative NA  Past Psychiatric History:  1995- [facility]- od on meds Admitted voluntarily in [place] - meds were off.   Previous Psychotropic Medications:  Trazodone neurontin zoloft Lithium- 10 years    Substance Abuse History in the last 12 months:  No.  Consequences of Substance Abuse: Negative NA  Past Medical History:  Past Medical History:  Diagnosis Date  . Anxiety   . Depression   . GERD (gastroesophageal reflux disease)   . Hyperlipidemia   . Osteoporosis     Past Surgical History:  Procedure Laterality Date  . CHOLECYSTECTOMY N/A 04/13/2015   Procedure: LAPAROSCOPIC CHOLECYSTECTOMY;  Surgeon: Gladis Riffle, MD;  Location: [facility] ORS;  Service: General;  Laterality: N/A;  . COCHLEAR IMPLANT Bilateral 10/15, 04/16  . JOINT REPLACEMENT Right    Hip  . MINOR EXCISION OF ORAL LESION N/A 08/30/2014   Procedure: MINOR EXCISION OF ORAL LESION;  Surgeon: Vernie Murders, MD;  Location[facility];  Service: ENT;  Laterality: N/A;  HARD PALATE LESION  . WRIST SURGERY Right     Family Psychiatric History: she denied   Family History:  Family History  Problem Relation Age of Onset  . Cancer Mother     lung  . Hypertension Mother   . Hyperlipidemia Mother   . Lung disease Maternal Grandmother   . Emphysema Father   . Thyroid disease Father     Social History:   Social History   Social History  . Marital status: Married    Spouse name: N/A  . Number of children: N/A  . Years of education: N/A   Social History Main Topics  . Smoking status: Former Smoker    Quit date: 04/13/2006  . Smokeless tobacco: Never Used  . Alcohol use No  . Drug use: No  .  Sexual activity: Yes   Other Topics Concern  . None   Social History Narrative  . None    Additional Social History: husband passed away in April 2017.  Currently lives by herself. 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   Allergies:  No Known Allergies  Metabolic Disorder Labs: No results found for: HGBA1C, MPG No results found for: PROLACTIN Lab Results  Component Value Date   CHOL 204 (H) 01/08/2015   TRIG 251 (H) 01/08/2015   HDL 53 01/08/2015   LDLCALC 101 (H) 01/08/2015      Current Medications: Current Outpatient Prescriptions  Medication Sig Dispense Refill  . ARIPiprazole (ABILIFY) 10 MG tablet Take 1 tablet (10 mg total) by mouth daily. AM 30 tablet 1  . buPROPion (WELLBUTRIN) 75 MG tablet Take 1 tablet (75 mg total) by mouth daily after breakfast. 30 tablet 1  . cholecalciferol (VITAMIN D) 1000 units tablet Take 1,000 Units by mouth daily.    Marland Kitchen. ibuprofen (ADVIL,MOTRIN) 200 MG tablet Take 400 mg by mouth every 4 (four) hours as needed.    Marland Kitchen. levothyroxine (SYNTHROID, LEVOTHROID) 112 MCG tablet TAKE 1 TABLET (112 MCG TOTAL) BY MOUTH DAILY BEFORE BREAKFAST. 90 tablet 2  . loratadine (CLARITIN) 10 MG tablet Take by mouth.    Marland Kitchen. LORazepam (ATIVAN) 0.5 MG tablet Take 1 tablet (0.5 mg total) by mouth daily. 30 tablet 1  . mirtazapine (REMERON) 15 MG tablet Take 1 tablet (15 mg total) by mouth at bedtime. 30 tablet 1  . omeprazole (PRILOSEC) 20 MG capsule Take 20 mg by mouth daily.     . raloxifene (EVISTA) 60 MG tablet TAKE 1 TABLET BY MOUTH EVERY DAY 90 tablet 4   No current facility-administered medications for this visit.     Neurologic: Headache: No Seizure: No Paresthesias:No  Musculoskeletal: Strength & Muscle Tone: within normal limits Gait & Station: normal Patient leans: N/A  Psychiatric Specialty Exam: Review of Systems  Psychiatric/Behavioral: Positive for depression. The patient is nervous/anxious and has insomnia.   All other systems reviewed and are negative.   Blood pressure 131/85, pulse 87, temperature 98 F (36.7 C), temperature source Oral, height 5\' 4"  (1.626 m), weight 169 lb 9.6 oz (76.9 kg).Body mass index is 29.11 kg/m.  General Appearance: Casual  Eye Contact:  Fair  Speech:  Clear and Coherent  Volume:  Normal  Mood:  Anxious  Affect:  Congruent  Thought Process:  Coherent  Orientation:  Full (Time, Place, and Person)  Thought Content:  WDL  Suicidal Thoughts:  No  Homicidal Thoughts:  No  Memory:  Immediate;    Fair Recent;   Fair Remote;   Fair  Judgement:  Fair  Insight:  Fair  Psychomotor Activity:  Normal  Concentration:  Concentration: Fair and Attention Span: Fair  Recall:  [employer]Fair  Fund of Knowledge:Fair  Language: Fair  Akathisia:  No  Handed:  Right  AIMS (if indicated):    Assets:  Communication Skills Desire for Improvement Physical Health Social Support  ADL's:  Intact  Cognition: WNL  Sleep:  poor    Treatment Plan Summary: Medication management   Discussed with patient about her medications. I will make an adjustment to her medication as follows  Abilify 10 mg daily Continue  Wellbutrin 75 mg in the morning Decrease lorazepam 0.25mg  at noon and bedtime  Continue  the Remeron 15 mg at bedtime Advised patient that if she notices worsening of symptoms she should call for an early appointment  Follow-up in 2 months or earlier depending on her symptoms   More than 50% of the time spent in psychoeducation, counseling and coordination of care.    This note was generated in part or whole with voice recognition software. Voice regonition is usually quite accurate but there are transcription errors that can and very often do occur. I apologize for any typographical errors that were not detected and corrected.    Brandy Hale, MD 8/30/20171:16 PM

## 2015-12-18 ENCOUNTER — Other Ambulatory Visit: Payer: Self-pay | Admitting: Psychiatry

## 2015-12-23 NOTE — Telephone Encounter (Signed)
noted 

## 2015-12-27 ENCOUNTER — Ambulatory Visit (INDEPENDENT_AMBULATORY_CARE_PROVIDER_SITE_OTHER): Payer: 59 | Admitting: Family Medicine

## 2015-12-27 ENCOUNTER — Encounter: Payer: Self-pay | Admitting: Family Medicine

## 2015-12-27 VITALS — BP 120/84 | HR 77 | Temp 98.4°F | Ht 64.0 in | Wt 169.0 lb

## 2015-12-27 DIAGNOSIS — J029 Acute pharyngitis, unspecified: Secondary | ICD-10-CM

## 2015-12-27 DIAGNOSIS — J40 Bronchitis, not specified as acute or chronic: Secondary | ICD-10-CM | POA: Diagnosis not present

## 2015-12-27 MED ORDER — CETIRIZINE HCL 10 MG PO TABS: 10.0000 mg | ORAL_TABLET | Freq: Every day | ORAL | 11 refills | Status: AC

## 2015-12-27 MED ORDER — MONTELUKAST SODIUM 10 MG PO TABS
10.0000 mg | ORAL_TABLET | Freq: Every day | ORAL | 3 refills | Status: DC
Start: 2015-12-27 — End: 2016-02-10

## 2015-12-27 MED ORDER — BENZONATATE 100 MG PO CAPS: 100.0000 mg | ORAL_CAPSULE | Freq: Two times a day (BID) | ORAL | 0 refills | Status: DC | PRN

## 2015-12-27 MED ORDER — ALBUTEROL SULFATE HFA 108 (90 BASE) MCG/ACT IN AERS: 2.0000 | INHALATION_SPRAY | Freq: Four times a day (QID) | RESPIRATORY_TRACT | 0 refills | Status: DC | PRN

## 2015-12-27 NOTE — Patient Instructions (Signed)
Follow up if no improvement 

## 2015-12-27 NOTE — Progress Notes (Signed)
BP 120/84 (BP Location: Right Arm, Patient Position: Sitting, Cuff Size: Normal)   Pulse 77   Temp 98.4 F (36.9 C)   Ht 5\' 4"  (1.626 m)   Wt 169 lb (76.7 kg)   LMP  (LMP Unknown)   SpO2 94%   BMI 29.01 kg/m    Subjective:    Patient ID: Cindy Neal, female    DOB: 06/16/1955, 60 y.o.   MRN: [id number]  HPI: Cindy Neal is a 60 y.o. female  Chief Complaint  Patient presents with  . Bronchitis  . Sore Throat  . Cough   Patient presents for bronchitis follow up. Has been [facility] in the last [facility] and a half [facility] for [facility] cough and upper respiratory symptoms. Just finished biaxin and hycodan syrup several days ago that was given there. CXR last week was clear. Still having the cough, chest tightness, and more predominant sore throat now. Is out of hycodan, but states it worked well while she had it. Has been taking claritin and flonase, not noticing a big difference with them but has been on claritin for years.  Relevant past medical, surgical, family and social history reviewed and updated as indicated. Interim medical history since our last visit reviewed. Allergies and medications reviewed and updated.  Review of Systems  Constitutional: Negative for chills, diaphoresis and fever.  HENT: Positive for congestion and sore throat.   Eyes: Negative.   Respiratory: Positive for cough, chest tightness and wheezing.   Cardiovascular: Negative.   Gastrointestinal: Negative.   Genitourinary: Negative.   Musculoskeletal: Negative.   Neurological: Negative.   Psychiatric/Behavioral: Negative.     Per HPI unless specifically indicated above     Objective:    BP 120/84 (BP Location: Right Arm, Patient Position: Sitting, Cuff Size: Normal)   Pulse 77   Temp 98.4 F (36.9 C)   Ht 5\' 4"  (1.626 m)   Wt 169 lb (76.7 kg)   LMP  (LMP Unknown)   SpO2 94%   BMI 29.01 kg/m   Wt Readings from Last 3 Encounters:  12/27/15 169 lb (76.7 kg)  12/11/15 169 lb 9.6 oz  (76.9 kg)  10/03/15 162 lb (73.5 kg)    Physical Exam  Constitutional: She is oriented to person, place, and time. She appears well-developed and well-nourished.  HENT:  Head: Atraumatic.  Right Ear: External ear normal.  Left Ear: External ear normal.  Mouth/Throat: Oropharynx is clear and moist. No oropharyngeal exudate.  Eyes: Conjunctivae are normal. No scleral icterus.  Neck: Normal range of motion. Neck supple.  Cardiovascular: Normal rate and normal heart sounds.   Pulmonary/Chest: Effort normal. No respiratory distress. She has wheezes (moderate diffuse wheezes). She has no rales.  Musculoskeletal: Normal range of motion.  Lymphadenopathy:    She has no cervical adenopathy.  Neurological: She is alert and oriented to person, place, and time.  Skin: Skin is warm and dry.  Psychiatric: She has a normal mood and affect. Her behavior is normal.      Assessment & Plan:   Problem List Items Addressed This Visit    None    Visit Diagnoses    Bronchitis    -  Primary   Still with moderate wheezing and chest tightness, albuterol and tessalon perles sent. Increased allergy regimen with zyrtec and singulair + flonase daily.   Sore throat       Negative strep, await cx. Likely sore from irritation of persistent cough x 1.5 months.  OTC chloroseptic sprays and throat lozenges   Relevant Orders   Rapid strep screen (not at [facility])       Follow up plan: Return if symptoms worsen or fail to improve.

## 2015-12-30 LAB — RAPID STREP SCREEN (MED CTR MEBANE ONLY): STREP GP A AG, IA W/REFLEX: NEGATIVE

## 2015-12-30 LAB — CULTURE, GROUP A STREP: Strep A Culture: NEGATIVE

## 2016-01-15 ENCOUNTER — Ambulatory Visit (INDEPENDENT_AMBULATORY_CARE_PROVIDER_SITE_OTHER): Payer: 59 | Admitting: Unknown Physician Specialty

## 2016-01-15 ENCOUNTER — Encounter: Payer: Self-pay | Admitting: Unknown Physician Specialty

## 2016-01-15 VITALS — BP 130/83 | HR 77 | Temp 98.2°F | Ht 64.1 in | Wt 170.0 lb

## 2016-01-15 DIAGNOSIS — Z23 Encounter for immunization: Secondary | ICD-10-CM | POA: Diagnosis not present

## 2016-01-15 DIAGNOSIS — E038 Other specified hypothyroidism: Secondary | ICD-10-CM

## 2016-01-15 DIAGNOSIS — Z0000 Encounter for general adult medical examination without abnormal findings: Secondary | ICD-10-CM | POA: Diagnosis not present

## 2016-01-15 MED ORDER — ALBUTEROL SULFATE HFA 108 (90 BASE) MCG/ACT IN AERS: 2.0000 | INHALATION_SPRAY | Freq: Four times a day (QID) | RESPIRATORY_TRACT | 1 refills | Status: DC | PRN

## 2016-01-15 MED ORDER — RALOXIFENE HCL 60 MG PO TABS: 60.0000 mg | ORAL_TABLET | Freq: Every day | ORAL | 4 refills | Status: DC

## 2016-01-15 MED ORDER — LEVOTHYROXINE SODIUM 112 MCG PO TABS: ORAL_TABLET | ORAL | 2 refills | Status: DC

## 2016-01-15 NOTE — Progress Notes (Signed)
BP 130/83 (BP Location: Left Arm, Patient Position: Sitting, Cuff Size: Large)   Pulse 77   Temp 98.2 F (36.8 C)   Ht 5' 4.1" (1.628 m)   Wt 170 lb (77.1 kg)   LMP  (LMP Unknown)   SpO2 94%   BMI 29.09 kg/m    Subjective:    Patient ID: Cindy Neal, female    DOB: 07/08/1955, 60 y.o.   MRN: [id number]  HPI: Cindy HackerVickie L Vanderhoof is a 60 y.o. female  Chief Complaint  Patient presents with  . Annual Exam   Social History   Social History  . Marital status: Married    Spouse name: N/A  . Number of children: N/A  . Years of education: N/A   Occupational History  . Not on file.   Social History Main Topics  . Smoking status: Former Smoker    Quit date: 04/13/2006  . Smokeless tobacco: Never Used  . Alcohol use No  . Drug use: No  . Sexual activity: No   Other Topics Concern  . Not on file   Social History Narrative  . No narrative on file   Family History  Problem Relation Age of Onset  . Cancer Mother     lung  . Hypertension Mother   . Hyperlipidemia Mother   . Lung disease Maternal Grandmother   . Emphysema Father   . Thyroid disease Father    Past Medical History:  Diagnosis Date  . Anxiety   . Depression   . GERD (gastroesophageal reflux disease)   . Hyperlipidemia   . Osteoporosis    Past Surgical History:  Procedure Laterality Date  . CHOLECYSTECTOMY N/A 04/13/2015   Procedure: LAPAROSCOPIC CHOLECYSTECTOMY;  Surgeon: Gladis Riffleatherine L Loflin, MD;  Location: [facility] ORS;  Service: General;  Laterality: N/A;  . COCHLEAR IMPLANT Bilateral 10/15, 04/16  . JOINT REPLACEMENT Right    Hip  . MINOR EXCISION OF ORAL LESION N/A 08/30/2014   Procedure: MINOR EXCISION OF ORAL LESION;  Surgeon: Vernie MurdersPaul Juengel, MD;  [facility];  Service: ENT;  Laterality: N/A;  HARD PALATE LESION  . WRIST SURGERY Right     Needs a refill of Evista and Levothyroxine  Relevant past medical, surgical, family and social history reviewed and updated as indicated.  Interim medical history since our last visit reviewed. Allergies and medications reviewed and updated.  Review of Systems  Constitutional: Negative.   HENT: Negative.   Eyes: Negative.   Respiratory:       Still has a cough and some wheezing  Cardiovascular: Negative.   Gastrointestinal: Negative.   Endocrine: Negative.   Genitourinary: Negative.   Musculoskeletal: Negative.   Skin: Negative.   Allergic/Immunologic: Negative.   Neurological: Negative.   Hematological: Negative.   Psychiatric/Behavioral: Negative.     Per HPI unless specifically indicated above     Objective:    BP 130/83 (BP Location: Left Arm, Patient Position: Sitting, Cuff Size: Large)   Pulse 77   Temp 98.2 F (36.8 C)   Ht 5' 4.1" (1.628 m)   Wt 170 lb (77.1 kg)   LMP  (LMP Unknown)   SpO2 94%   BMI 29.09 kg/m   Wt Readings from Last 3 Encounters:  01/15/16 170 lb (77.1 kg)  12/27/15 169 lb (76.7 kg)  12/11/15 169 lb 9.6 oz (76.9 kg)    Physical Exam  Constitutional: She is oriented to person, place, and time. She appears well-developed and well-nourished.  HENT:  Head: Normocephalic and atraumatic.  Eyes: Pupils are equal, round, and reactive to light. Right eye exhibits no discharge. Left eye exhibits no discharge. No scleral icterus.  Neck: Normal range of motion. Neck supple. Carotid bruit is not present. No thyromegaly present.  Cardiovascular: Normal rate, regular rhythm and normal heart sounds.  Exam reveals no gallop and no friction rub.   No murmur heard. Pulmonary/Chest: Effort normal and breath sounds normal. No respiratory distress. She has no wheezes. She has no rales.  Abdominal: Soft. Bowel sounds are normal. There is no tenderness. There is no rebound.  Genitourinary: Vagina normal and uterus normal. No breast swelling, tenderness or discharge. Cervix exhibits no motion tenderness, no discharge and no friability. Right adnexum displays no mass, no tenderness and no fullness.  Left adnexum displays no mass, no tenderness and no fullness.  Musculoskeletal: Normal range of motion.  Lymphadenopathy:    She has no cervical adenopathy.  Neurological: She is alert and oriented to person, place, and time.  Skin: Skin is warm, dry and intact. No rash noted.  Psychiatric: She has a normal mood and affect. Her speech is normal and behavior is normal. Judgment and thought content normal. Cognition and memory are normal.    Results for orders placed or performed in visit on 12/27/15  Rapid strep screen (not at [facility])  Result Value Ref Range   Strep Gp A Ag, IA W/Reflex Negative Negative  Culture, Group A Strep  Result Value Ref Range   Strep A Culture Negative       Assessment & Plan:   Problem List Items Addressed This Visit      Unprioritized   Hypothyroid   Relevant Medications   levothyroxine (SYNTHROID, LEVOTHROID) 112 MCG tablet   Other Relevant Orders   TSH    Other Visit Diagnoses    Need for influenza vaccination    -  Primary   Relevant Orders   Flu Vaccine QUAD 36+ mos IM (Completed)   Routine general medical examination at a health care facility       Relevant Orders   CBC with Differential/Platelet   Comprehensive metabolic panel   Lipid Panel w/o Chol/HDL Ratio   MM DIGITAL SCREENING BILATERAL   IGP, Aptima HPV, rfx 16/18,45       Follow up plan: Return in about 1 year (around 01/14/2017).

## 2016-01-15 NOTE — Patient Instructions (Signed)

## 2016-01-16 LAB — TSH: TSH: 2.96 u[IU]/mL (ref 0.450–4.500)

## 2016-01-16 LAB — LIPID PANEL W/O CHOL/HDL RATIO
Cholesterol, Total: 219 mg/dL — ABNORMAL HIGH (ref 100–199)
HDL: 54 mg/dL (ref >39)
LDL Calculated: 123 mg/dL — ABNORMAL HIGH (ref 0–99)
Triglycerides: 210 mg/dL — ABNORMAL HIGH (ref 0–149)
VLDL Cholesterol Cal: 42 mg/dL — ABNORMAL HIGH (ref 5–40)

## 2016-01-16 LAB — CBC WITH DIFFERENTIAL/PLATELET
BASOS: 0 %
Basophils Absolute: 0 10*3/uL (ref 0.0–0.2)
EOS (ABSOLUTE): 0.2 10*3/uL (ref 0.0–0.4)
EOS: 3 %
HEMATOCRIT: 39.7 % (ref 34.0–46.6)
HEMOGLOBIN: 13.2 g/dL (ref 11.1–15.9)
IMMATURE GRANS (ABS): 0 10*3/uL (ref 0.0–0.1)
IMMATURE GRANULOCYTES: 0 %
Lymphocytes Absolute: 1.9 10*3/uL (ref 0.7–3.1)
Lymphs: 29 %
MCH: 29.4 pg (ref 26.6–33.0)
MCHC: 33.2 g/dL (ref 31.5–35.7)
MCV: 88 fL (ref 79–97)
Monocytes Absolute: 0.5 10*3/uL (ref 0.1–0.9)
Monocytes: 8 %
NEUTROS ABS: 4 10*3/uL (ref 1.4–7.0)
NEUTROS PCT: 60 %
Platelets: 263 10*3/uL (ref 150–379)
RBC: 4.49 x10E6/uL (ref 3.77–5.28)
RDW: 13.5 % (ref 12.3–15.4)
WBC: 6.7 10*3/uL (ref 3.4–10.8)

## 2016-01-16 LAB — COMPREHENSIVE METABOLIC PANEL
A/G RATIO: 1.6 (ref 1.2–2.2)
ALBUMIN: 3.9 g/dL (ref 3.6–4.8)
ALT: 17 IU/L (ref 0–32)
AST: 14 IU/L (ref 0–40)
Alkaline Phosphatase: 78 IU/L (ref 39–117)
BUN / CREAT RATIO: 15 (ref 12–28)
BUN: 8 mg/dL (ref 8–27)
Bilirubin Total: 0.3 mg/dL (ref 0.0–1.2)
CALCIUM: 9.1 mg/dL (ref 8.7–10.3)
CO2: 25 mmol/L (ref 18–29)
Chloride: 104 mmol/L (ref 96–106)
Creatinine, Ser: 0.55 mg/dL — ABNORMAL LOW (ref 0.57–1.00)
GFR, EST AFRICAN AMERICAN: 117 mL/min/{1.73_m2} (ref >59)
GFR, EST NON AFRICAN AMERICAN: 102 mL/min/{1.73_m2} (ref >59)
GLOBULIN, TOTAL: 2.5 g/dL (ref 1.5–4.5)
Glucose: 75 mg/dL (ref 65–99)
POTASSIUM: 4.3 mmol/L (ref 3.5–5.2)
SODIUM: 144 mmol/L (ref 134–144)
TOTAL PROTEIN: 6.4 g/dL (ref 6.0–8.5)

## 2016-01-17 NOTE — Progress Notes (Signed)
Notified pt by mychart

## 2016-01-21 LAB — IGP, APTIMA HPV, RFX 16/18,45
HPV APTIMA: NEGATIVE
PAP Smear Comment: 0

## 2016-02-10 ENCOUNTER — Other Ambulatory Visit: Payer: Self-pay

## 2016-02-10 MED ORDER — MONTELUKAST SODIUM 10 MG PO TABS: 10.0000 mg | ORAL_TABLET | Freq: Every day | ORAL | 3 refills | Status: DC

## 2016-02-10 NOTE — Telephone Encounter (Signed)
Routing to provider, she would like rx changed to a 90 day supply.

## 2016-02-11 ENCOUNTER — Ambulatory Visit (INDEPENDENT_AMBULATORY_CARE_PROVIDER_SITE_OTHER): Payer: 59 | Admitting: Psychiatry

## 2016-02-11 ENCOUNTER — Encounter: Payer: Self-pay | Admitting: Psychiatry

## 2016-02-11 VITALS — BP 122/79 | HR 99 | Temp 98.1°F | Wt 173.8 lb

## 2016-02-11 DIAGNOSIS — F39 Unspecified mood [affective] disorder: Secondary | ICD-10-CM | POA: Diagnosis not present

## 2016-02-11 DIAGNOSIS — F411 Generalized anxiety disorder: Secondary | ICD-10-CM

## 2016-02-11 MED ORDER — ARIPIPRAZOLE 10 MG PO TABS: 10.0000 mg | ORAL_TABLET | Freq: Every day | ORAL | 1 refills | Status: DC

## 2016-02-11 MED ORDER — MIRTAZAPINE 15 MG PO TABS: 15.0000 mg | ORAL_TABLET | Freq: Every day | ORAL | 1 refills | Status: DC

## 2016-02-11 MED ORDER — BUPROPION HCL 75 MG PO TABS: 75.0000 mg | ORAL_TABLET | Freq: Every day | ORAL | 1 refills | Status: DC

## 2016-02-11 MED ORDER — LORAZEPAM 0.5 MG PO TABS: 0.5000 mg | ORAL_TABLET | Freq: Every day | ORAL | 2 refills | Status: DC

## 2016-02-11 NOTE — Progress Notes (Signed)
Psychiatric MD Progress Note  Patient Identification: Cindy Neal MRN:  [id number] Date of Evaluation:  02/11/2016 [facility] Source[facility]  Chief Complaint:   Chief Complaint    Follow-up; Medication Refill     Visit Diagnosis:    ICD-9-CM ICD-10-CM   1. Episodic mood disorder (HCC) 296.90 F39   2. Generalized anxiety disorder 300.02 F41.1     History of Present Illness:    Patient is a 60 year old widowed female who presented for follow up. [facility]. She has history of bilateral cochlear implant and Is hard of hearing. Patient reported that she has been doing well on her medications. Patient is currently working in the [employer] store  and reported that she is getting busy with the holiday season coming up. She is compliant with her medications. Patient reported that the medications are helping her. She stated that she sleeps well with the help of lorazepam and Remeron. She has been taking lorazepam half pill twice daily. She appeared calm and alert during the interview. She denied having any side effects of medications. She denied having any perceptual disturbances.   She reported that the medication has really been very helpful and she feels energetic in the day. Occasionally she will have problem with the sleep. She feels that the medications makes her feel energetic. She denied having any side effects of the medications.   She denied having any suicidal homicidal ideations or plans.  Associated Signs/Symptoms: Depression Symptoms:  depressed mood, anxiety, (Hypo) Manic Symptoms:  Flight of Ideas, Irritable Mood, Labiality of Mood, Anxiety Symptoms:  Excessive Worry, Psychotic Symptoms:  none PTSD Symptoms: Negative NA  Past Psychiatric History:  1995- [facility]- od on meds Admitted voluntarily in [place] - meds were off.   Previous Psychotropic Medications:  Trazodone neurontin zoloft Lithium- 10 years   Substance  Abuse History in the last 12 months:  No.  Consequences of Substance Abuse: Negative NA  Past Medical History:  Past Medical History:  Diagnosis Date  . Anxiety   . Depression   . GERD (gastroesophageal reflux disease)   . Hyperlipidemia   . Osteoporosis     Past Surgical History:  Procedure Laterality Date  . CHOLECYSTECTOMY N/A 04/13/2015   Procedure: LAPAROSCOPIC CHOLECYSTECTOMY;  Surgeon: Gladis Riffleatherine L Loflin, MD;  Location: [facility] ORS;  Service: General;  Laterality: N/A;  . COCHLEAR IMPLANT Bilateral 10/15, 04/16  . JOINT REPLACEMENT Right    Hip  . MINOR EXCISION OF ORAL LESION N/A 08/30/2014   Procedure: MINOR EXCISION OF ORAL LESION;  Surgeon: Vernie MurdersPaul Juengel, MD;  [facility];  Service: ENT;  Laterality: N/A;  HARD PALATE LESION  . WRIST SURGERY Right     Family Psychiatric History: she denied   Family History:  Family History  Problem Relation Age of Onset  . Cancer Mother     lung  . Hypertension Mother   . Hyperlipidemia Mother   . Lung disease Maternal Grandmother   . Emphysema Father   . Thyroid disease Father     Social History:   Social History   Social History  . Marital status: Married    Spouse name: N/A  . Number of children: N/A  . Years of education: N/A   Social History Main Topics  . Smoking status: Former Smoker    Quit date: 04/13/2006  . Smokeless tobacco: Never Used  . Alcohol use No  . Drug use: No  . Sexual activity: No  Other Topics Concern  . None   Social History Narrative  . None    Additional Social History: husband passed away in 08/10/2015.  Currently lives by herself. She works in the Music therapist at [employer].   Allergies:  No Known Allergies  Metabolic Disorder Labs: No results found for: HGBA1C, MPG No results found for: PROLACTIN Lab Results  Component Value Date   CHOL 219 (H) 01/15/2016   TRIG 210 (H) 01/15/2016   HDL 54 01/15/2016   LDLCALC 123 (H) 01/15/2016   LDLCALC 101 (H)  01/08/2015     Current Medications: Current Outpatient Prescriptions  Medication Sig Dispense Refill  . albuterol (PROVENTIL HFA;VENTOLIN HFA) 108 (90 Base) MCG/ACT inhaler Inhale 2 puffs into the lungs every 6 (six) hours as needed for wheezing or shortness of breath. 1 Inhaler 1  . ARIPiprazole (ABILIFY) 10 MG tablet Take 1 tablet (10 mg total) by mouth daily. AM 30 tablet 1  . aspirin EC 81 MG tablet Take 81 mg by mouth daily.    . benzonatate (TESSALON) 100 MG capsule Take 1 capsule (100 mg total) by mouth 2 (two) times daily as needed for cough. 40 capsule 0  . buPROPion (WELLBUTRIN) 75 MG tablet TAKE 1 TABLET (75 MG TOTAL) BY MOUTH DAILY AFTER BREAKFAST. 30 tablet 0  . cetirizine (ZYRTEC) 10 MG tablet Take 1 tablet (10 mg total) by mouth daily. 30 tablet 11  . cholecalciferol (VITAMIN D) 1000 units tablet Take 1,000 Units by mouth daily.    Marland Kitchen ibuprofen (ADVIL,MOTRIN) 200 MG tablet Take 400 mg by mouth every 4 (four) hours as needed.    Marland Kitchen levothyroxine (SYNTHROID, LEVOTHROID) 112 MCG tablet TAKE 1 TABLET (112 MCG TOTAL) BY MOUTH DAILY BEFORE BREAKFAST. 90 tablet 2  . LORazepam (ATIVAN) 0.5 MG tablet Take 1 tablet (0.5 mg total) by mouth daily. 30 tablet 1  . mirtazapine (REMERON) 15 MG tablet Take 1 tablet (15 mg total) by mouth at bedtime. 30 tablet 1  . montelukast (SINGULAIR) 10 MG tablet Take 1 tablet (10 mg total) by mouth at bedtime. 90 tablet 3  . omeprazole (PRILOSEC) 20 MG capsule Take 20 mg by mouth daily.     . raloxifene (EVISTA) 60 MG tablet Take 1 tablet (60 mg total) by mouth daily. 90 tablet 4  . vitamin C (ASCORBIC ACID) 500 MG tablet Take 500 mg by mouth daily.     No current facility-administered medications for this visit.     Neurologic: Headache: No Seizure: No Paresthesias:No  Musculoskeletal: Strength & Muscle Tone: within normal limits Gait & Station: normal Patient leans: N/A  Psychiatric Specialty Exam: Review of Systems  Psychiatric/Behavioral:  Positive for depression. The patient is nervous/anxious and has insomnia.   All other systems reviewed and are negative.   Blood pressure 122/79, pulse 99, temperature 98.1 F (36.7 C), temperature source Oral, weight 173 lb 12.8 oz (78.8 kg).Body mass index is 29.74 kg/m.  General Appearance: Casual  Eye Contact:  Fair  Speech:  Clear and Coherent  Volume:  Normal  Mood:  Anxious  Affect:  Congruent  Thought Process:  Coherent  Orientation:  Full (Time, Place, and Person)  Thought Content:  WDL  Suicidal Thoughts:  No  Homicidal Thoughts:  No  Memory:  Immediate;   Fair Recent;   Fair Remote;   Fair  Judgement:  Fair  Insight:  Fair  Psychomotor Activity:  Normal  Concentration:  Concentration: Fair and Attention Span: Fair  Recall:  Fair  Fund of Knowledge:Fair  Language: Fair  Akathisia:  No  Handed:  Right  AIMS (if indicated):    Assets:  Communication Skills Desire for Improvement Physical Health Social Support  ADL's:  Intact  Cognition: WNL  Sleep:  poor    Treatment Plan Summary: Medication management   Discussed with patient about her medications. I will make an adjustment to her medication as follows  Abilify 10 mg daily Continue  Wellbutrin 75 mg in the morning Decrease lorazepam 0.25mg  at noon and bedtime  Continue  the Remeron 15 mg at bedtime Advised patient that if she notices worsening of symptoms she should call for an early appointment  Follow-up in 2 months or earlier depending on her symptoms  Advised patient that I will be leaving this office in the end of November and she demonstrated understanding   More than 50% of the time spent in psychoeducation, counseling and coordination of care.    This note was generated in part or whole with voice recognition software. Voice regonition is usually quite accurate but there are transcription errors that can and very often do occur. I apologize for any typographical errors that were not detected  and corrected.    Brandy HaleUzma Copper Basnett, MD 10/31/20171:31 PM

## 2016-03-02 ENCOUNTER — Encounter: Payer: Self-pay | Admitting: Family Medicine

## 2016-03-02 ENCOUNTER — Ambulatory Visit
Admission: RE | Admit: 2016-03-02 | Discharge: 2016-03-02 | Disposition: A | Discharge status: Home / Self Care | Payer: 59 | Source: Ambulatory Visit | Attending: Family Medicine | Admitting: Family Medicine

## 2016-03-02 ENCOUNTER — Ambulatory Visit (INDEPENDENT_AMBULATORY_CARE_PROVIDER_SITE_OTHER): Payer: 59 | Admitting: Family Medicine

## 2016-03-02 VITALS — BP 123/86 | HR 80 | Temp 97.6°F | Wt 172.5 lb

## 2016-03-02 DIAGNOSIS — R05 Cough: Secondary | ICD-10-CM

## 2016-03-02 DIAGNOSIS — R059 Cough, unspecified: Secondary | ICD-10-CM

## 2016-03-02 DIAGNOSIS — K449 Diaphragmatic hernia without obstruction or gangrene: Secondary | ICD-10-CM | POA: Insufficient documentation

## 2016-03-02 MED ORDER — ALBUTEROL SULFATE HFA 108 (90 BASE) MCG/ACT IN AERS: 2.0000 | INHALATION_SPRAY | Freq: Four times a day (QID) | RESPIRATORY_TRACT | 6 refills | Status: DC | PRN

## 2016-03-02 NOTE — Progress Notes (Signed)
   BP 123/86 (BP Location: Right Arm, Patient Position: Sitting, Cuff Size: Normal)   Pulse 80   Temp 97.6 F (36.4 C)   Wt 172 lb 8 oz (78.2 kg)   LMP  (LMP Unknown)   SpO2 96%   BMI 29.52 kg/m    Subjective:    Patient ID: Cindy Neal, female    DOB: 09/03/1955, 60 y.o.   MRN: [id number]  HPI: Cindy Neal is a 60 y.o. female  Chief Complaint  Patient presents with  . Allergies   Patient presents with worsening persistent cough x 2-3 months. Now also having sinus pressure and congestion, sore throat, and wheezing and SOB. Having to use albuterol every 6 hours. Still taking zyrtec, singulair, and flonase daily. Denies fevers, CP, body aches.  Relevant past medical, surgical, family and social history reviewed and updated as indicated. Interim medical history since our last visit reviewed. Allergies and medications reviewed and updated.  Review of Systems  Constitutional: Negative.   HENT: Positive for congestion, sinus pain, sinus pressure and sore throat.   Respiratory: Positive for cough, chest tightness, shortness of breath and wheezing.   Cardiovascular: Negative.   Gastrointestinal: Negative.   Genitourinary: Negative.   Musculoskeletal: Negative.   Skin: Negative.   Neurological: Negative.   Psychiatric/Behavioral: Negative.     Per HPI unless specifically indicated above     Objective:    BP 123/86 (BP Location: Right Arm, Patient Position: Sitting, Cuff Size: Normal)   Pulse 80   Temp 97.6 F (36.4 C)   Wt 172 lb 8 oz (78.2 kg)   LMP  (LMP Unknown)   SpO2 96%   BMI 29.52 kg/m   Wt Readings from Last 3 Encounters:  03/02/16 172 lb 8 oz (78.2 kg)  01/15/16 170 lb (77.1 kg)  12/27/15 169 lb (76.7 kg)    Physical Exam  Constitutional: She is oriented to person, place, and time. She appears well-developed and well-nourished. No distress.  HENT:  Head: Atraumatic.  Right Ear: External ear normal.  Left Ear: External ear normal.  Nose: Nose  normal.  Mouth/Throat: Oropharynx is clear and moist.  Eyes: Conjunctivae are normal. No scleral icterus.  Neck: Normal range of motion. Neck supple.  Cardiovascular: Normal rate.   Pulmonary/Chest: Effort normal. No respiratory distress. She has wheezes (moderate wheezes diffusely).  Musculoskeletal: Normal range of motion.  Neurological: She is alert and oriented to person, place, and time.  Skin: Skin is warm and dry.  Psychiatric: She has a normal mood and affect. Her behavior is normal.  Nursing note and vitals reviewed.     Assessment & Plan:   Problem List Items Addressed This Visit    None    Visit Diagnoses    Cough    -  Primary   Await x-ray, will treat depending on results. Refilled albuterol inhaler. Continue allergy regimen and mucinex.    Relevant Orders   DG Chest 2 View       Follow up plan: Return if symptoms worsen or fail to improve.

## 2016-03-02 NOTE — Patient Instructions (Signed)
Follow up depending on results of chest x-ray

## 2016-03-03 ENCOUNTER — Telehealth: Payer: Self-pay | Admitting: Family Medicine

## 2016-03-03 MED ORDER — HYDROCOD POLST-CPM POLST ER 10-8 MG/5ML PO SUER: 5.0000 mL | Freq: Two times a day (BID) | ORAL | 0 refills | Status: DC | PRN

## 2016-03-03 MED ORDER — BENZONATATE 100 MG PO CAPS: 200.0000 mg | ORAL_CAPSULE | Freq: Three times a day (TID) | ORAL | 0 refills | Status: DC | PRN

## 2016-03-03 MED ORDER — PREDNISONE 20 MG PO TABS: 40.0000 mg | ORAL_TABLET | Freq: Every day | ORAL | 0 refills | Status: DC

## 2016-03-03 NOTE — Telephone Encounter (Signed)
Called patient and left a message 

## 2016-03-03 NOTE — Telephone Encounter (Signed)
Attempted to call pt to discuss x-ray results, no answer or identifiers in voicemail message so did not leave a voicemail. Her x-ray results were negative for pneumonia, showed bronchitis/inflammation in her lungs. I will send in prednisone and cough medicine for her and she should continue mucinex, albuterol.

## 2016-03-03 NOTE — Telephone Encounter (Signed)
Pt called back, discussed test results and treatment. She is agreeable to plan and will follow up if no improvement.  Please call her and let her know her tussionex is ready for pick up

## 2016-03-06 NOTE — Telephone Encounter (Signed)
Left message for patient to return my call.

## 2016-03-09 NOTE — Telephone Encounter (Signed)
Patient's breathing is improving. Notified that she can pick up the prescription.

## 2016-03-10 ENCOUNTER — Other Ambulatory Visit: Payer: Self-pay | Admitting: Family Medicine

## 2016-03-10 MED ORDER — BENZONATATE 100 MG PO CAPS: 200.0000 mg | ORAL_CAPSULE | Freq: Three times a day (TID) | ORAL | 0 refills | Status: DC | PRN

## 2016-03-10 MED ORDER — HYDROCOD POLST-CPM POLST ER 10-8 MG/5ML PO SUER: 5.0000 mL | Freq: Two times a day (BID) | ORAL | 0 refills | Status: DC | PRN

## 2016-05-12 ENCOUNTER — Ambulatory Visit: Payer: 59 | Admitting: Psychiatry

## 2016-05-19 ENCOUNTER — Ambulatory Visit: Payer: 59 | Admitting: Psychiatry

## 2016-05-19 ENCOUNTER — Telehealth: Payer: Self-pay

## 2016-05-19 NOTE — Telephone Encounter (Signed)
pt called states that dr. Garnetta Buddyfaheem r/s her appt today that was for 12:45 and she needs medication refill.  [facility]

## 2016-05-19 NOTE — Telephone Encounter (Signed)
left message on doctor's line refill the ativan with no additional refills

## 2016-06-08 ENCOUNTER — Encounter: Payer: Self-pay | Admitting: Psychiatry

## 2016-06-08 ENCOUNTER — Ambulatory Visit (INDEPENDENT_AMBULATORY_CARE_PROVIDER_SITE_OTHER): Payer: 59 | Admitting: Psychiatry

## 2016-06-08 VITALS — BP 126/84 | HR 76 | Temp 97.7°F | Wt 174.6 lb

## 2016-06-08 DIAGNOSIS — F411 Generalized anxiety disorder: Secondary | ICD-10-CM

## 2016-06-08 DIAGNOSIS — F39 Unspecified mood [affective] disorder: Secondary | ICD-10-CM

## 2016-06-08 MED ORDER — MIRTAZAPINE 15 MG PO TABS: 15.0000 mg | ORAL_TABLET | Freq: Every day | ORAL | 1 refills | Status: DC

## 2016-06-08 MED ORDER — BUPROPION HCL 75 MG PO TABS: 75.0000 mg | ORAL_TABLET | Freq: Every day | ORAL | 1 refills | Status: DC

## 2016-06-08 MED ORDER — LORAZEPAM 0.5 MG PO TABS: 0.5000 mg | ORAL_TABLET | Freq: Every day | ORAL | 2 refills | Status: DC

## 2016-06-08 MED ORDER — ARIPIPRAZOLE 10 MG PO TABS: 10.0000 mg | ORAL_TABLET | Freq: Every day | ORAL | 1 refills | Status: DC

## 2016-06-08 NOTE — Progress Notes (Signed)
Psychiatric MD Progress Note  Patient Identification: Cindy Neal MRN:  [id number] Date of Evaluation:  06/08/2016 Referral Source: [facility]  Chief Complaint:   Chief Complaint    Follow-up; Medication Refill     Visit Diagnosis:    ICD-9-CM ICD-10-CM   1. Episodic mood disorder (HCC) 296.90 F39   2. Generalized anxiety disorder 300.02 F41.1     History of Present Illness:    Patient is a 61 year old widowed female who presented for follow up. She was referred by [facility] psychiatry clinic. She has history of bilateral cochlear implant and Is hard of hearing. Patient reported that sheWas feeling depressed due to the death anniversary of her husband. She reported that he passed away last year around this time. She reported that she was trying to stay calm. She reported that she has been working at [employer] and now they have moved her position. She is still working about 30 hours. She reported that she has difficulty sleeping and she takes Remeron at bedtime. She denied having any suicidal ideations or plans. We discussed about increasing the physical activity and she reported that she will start walking when the weather will get better. She has been planning to go to [place] to see her sister in April. She has been compliant with her medications. She denied having any suicidal ideations or plans.    Associated Signs/Symptoms: Depression Symptoms:  depressed mood, anxiety, (Hypo) Manic Symptoms:  Flight of Ideas, Irritable Mood, Labiality of Mood, Anxiety Symptoms:  Excessive Worry, Psychotic Symptoms:  none PTSD Symptoms: Negative NA  Past Psychiatric History:  1995- [facility]- od on meds Admitted voluntarily in [place] - meds were off.   Previous Psychotropic Medications:  Trazodone neurontin zoloft Lithium- 10 years   Substance Abuse History in the last 12 months:  No.  Consequences of Substance Abuse: Negative NA  Past Medical History:  Past Medical  History:  Diagnosis Date  . Anxiety   . Depression   . GERD (gastroesophageal reflux disease)   . Hyperlipidemia   . Osteoporosis     Past Surgical History:  Procedure Laterality Date  . CHOLECYSTECTOMY N/A 04/13/2015   Procedure: LAPAROSCOPIC CHOLECYSTECTOMY;  Surgeon: Gladis Riffle, MD;  Location: [facility] ORS;  Service: General;  Laterality: N/A;  . COCHLEAR IMPLANT Bilateral 10/15, 04/16  . JOINT REPLACEMENT Right    Hip  . MINOR EXCISION OF ORAL LESION N/A 08/30/2014   Procedure: MINOR EXCISION OF ORAL LESION;  Surgeon: Vernie Murders, MD;  Location: [facility];  Service: ENT;  Laterality: N/A;  HARD PALATE LESION  . WRIST SURGERY Right     Family Psychiatric History: she denied   Family History:  Family History  Problem Relation Age of Onset  . Cancer Mother     lung  . Hypertension Mother   . Hyperlipidemia Mother   . Lung disease Maternal Grandmother   . Emphysema Father   . Thyroid disease Father     Social History:   Social History   Social History  . Marital status: Married    Spouse name: N/A  . Number of children: N/A  . Years of education: N/A   Social History Main Topics  . Smoking status: Former Smoker    Quit date: 04/13/2006  . Smokeless tobacco: Never Used  . Alcohol use No  . Drug use: No  . Sexual activity: No   Other Topics Concern  . None   Social History Narrative  . None  Additional Social History: husband passed away in April 2017.  Currently lives by herself. 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   Allergies:  No Known Allergies  Metabolic Disorder Labs: No results found for: HGBA1C, MPG No results found for: PROLACTIN Lab Results  Component Value Date   CHOL 219 (H) 01/15/2016   TRIG 210 (H) 01/15/2016   HDL 54 01/15/2016   LDLCALC 123 (H) 01/15/2016   LDLCALC 101 (H) 01/08/2015     Current Medications: Current Outpatient Prescriptions  Medication Sig Dispense Refill  . albuterol (PROVENTIL  HFA;VENTOLIN HFA) 108 (90 Base) MCG/ACT inhaler Inhale 2 puffs into the lungs every 6 (six) hours as needed for wheezing or shortness of breath. 1 Inhaler 6  . ARIPiprazole (ABILIFY) 10 MG tablet Take 1 tablet (10 mg total) by mouth daily. AM 90 tablet 1  . aspirin EC 81 MG tablet Take 81 mg by mouth daily.    Marland Kitchen. buPROPion (WELLBUTRIN) 75 MG tablet Take 1 tablet (75 mg total) by mouth daily after breakfast. 90 tablet 1  . cetirizine (ZYRTEC) 10 MG tablet Take 1 tablet (10 mg total) by mouth daily. 30 tablet 11  . cholecalciferol (VITAMIN D) 1000 units tablet Take 1,000 Units by mouth daily.    Marland Kitchen. ibuprofen (ADVIL,MOTRIN) 200 MG tablet Take 400 mg by mouth every 4 (four) hours as needed.    Marland Kitchen. levothyroxine (SYNTHROID, LEVOTHROID) 112 MCG tablet TAKE 1 TABLET (112 MCG TOTAL) BY MOUTH DAILY BEFORE BREAKFAST. 90 tablet 2  . LORazepam (ATIVAN) 0.5 MG tablet Take 1 tablet (0.5 mg total) by mouth daily. 30 tablet 2  . mirtazapine (REMERON) 15 MG tablet Take 1 tablet (15 mg total) by mouth at bedtime. 90 tablet 1  . montelukast (SINGULAIR) 10 MG tablet Take 1 tablet (10 mg total) by mouth at bedtime. 90 tablet 3  . omeprazole (PRILOSEC) 20 MG capsule Take 20 mg by mouth daily.     . raloxifene (EVISTA) 60 MG tablet Take 1 tablet (60 mg total) by mouth daily. 90 tablet 4  . vitamin C (ASCORBIC ACID) 500 MG tablet Take 500 mg by mouth daily.    . benzonatate (TESSALON) 100 MG capsule Take 2 capsules (200 mg total) by mouth 3 (three) times daily as needed. (Patient not taking: Reported on 06/08/2016) 45 capsule 0  . chlorpheniramine-HYDROcodone (TUSSIONEX PENNKINETIC ER) 10-8 MG/5ML SUER Take 5 mLs by mouth every 12 (twelve) hours as needed for cough. (Patient not taking: Reported on 06/08/2016) 115 mL 0  . predniSONE (DELTASONE) 20 MG tablet Take 2 tablets (40 mg total) by mouth daily with breakfast. (Patient not taking: Reported on 06/08/2016) 10 tablet 0   No current facility-administered medications for this  visit.     Neurologic: Headache: No Seizure: No Paresthesias:No  Musculoskeletal: Strength & Muscle Tone: within normal limits Gait & Station: normal Patient leans: N/A  Psychiatric Specialty Exam: Review of Systems  Psychiatric/Behavioral: Positive for depression. The patient is nervous/anxious and has insomnia.   All other systems reviewed and are negative.   Blood pressure 126/84, pulse 76, temperature 97.7 F (36.5 C), temperature source Oral, weight 174 lb 9.6 oz (79.2 kg).Body mass index is 29.88 kg/m.  General Appearance: Casual  Eye Contact:  Fair  Speech:  Clear and Coherent  Volume:  Normal  Mood:  Anxious  Affect:  Congruent  Thought Process:  Coherent  Orientation:  Full (Time, Place, and Person)  Thought Content:  WDL  Suicidal Thoughts:  No  Homicidal Thoughts:  No  Memory:  Immediate;   Fair Recent;   Fair Remote;   Fair  Judgement:  Fair  Insight:  Fair  Psychomotor Activity:  Normal  Concentration:  Concentration: Fair and Attention Span: Fair  Recall:  [employer] of Knowledge:Fair  Language: Fair  Akathisia:  No  Handed:  Right  AIMS (if indicated):    Assets:  Communication Skills Desire for Improvement Physical Health Social Support  ADL's:  Intact  Cognition: WNL  Sleep:  poor    Treatment Plan Summary: Medication management   Discussed with patient about her medications. I will make an adjustment to her medication as follows  Abilify 10 mg daily Continue  Wellbutrin 75 mg in the morning Decrease lorazepam 0.25mg  at noon and bedtime  Continue  the Remeron 15 mg at bedtime Advised patient that if she notices worsening of symptoms she should call for an early appointment  Follow-up in 3 months or earlier depending on her symptoms  Advised patient that I will be leaving this office in the end of November and she demonstrated understanding   More than 50% of the time spent in psychoeducation, counseling and coordination of care.     This note was generated in part or whole with voice recognition software. Voice regonition is usually quite accurate but there are transcription errors that can and very often do occur. I apologize for any typographical errors that were not detected and corrected.    Brandy Hale, MD 2/26/201810:08 AM

## 2016-07-28 ENCOUNTER — Encounter: Payer: Self-pay | Admitting: Family Medicine

## 2016-07-28 ENCOUNTER — Ambulatory Visit (INDEPENDENT_AMBULATORY_CARE_PROVIDER_SITE_OTHER): Payer: 59 | Admitting: Family Medicine

## 2016-07-28 VITALS — BP 134/82 | HR 83 | Temp 97.9°F | Wt 172.0 lb

## 2016-07-28 DIAGNOSIS — S2249XD Multiple fractures of ribs, unspecified side, subsequent encounter for fracture with routine healing: Secondary | ICD-10-CM

## 2016-07-28 MED ORDER — MELOXICAM 15 MG PO TABS: 15.0000 mg | ORAL_TABLET | Freq: Every day | ORAL | 0 refills | Status: DC

## 2016-07-28 MED ORDER — CYCLOBENZAPRINE HCL 10 MG PO TABS: 10.0000 mg | ORAL_TABLET | Freq: Three times a day (TID) | ORAL | 0 refills | Status: DC | PRN

## 2016-07-28 MED ORDER — TRAMADOL HCL 50 MG PO TABS: 50.0000 mg | ORAL_TABLET | Freq: Three times a day (TID) | ORAL | 0 refills | Status: DC | PRN

## 2016-07-28 NOTE — Patient Instructions (Signed)
Follow up as needed

## 2016-07-28 NOTE — Progress Notes (Signed)
   BP 134/82   Pulse 83   Temp 97.9 F (36.6 C)   Wt 172 lb (78 kg)   LMP  (LMP Unknown)   SpO2 95%   BMI 29.43 kg/m    Subjective:    Patient ID: Cindy Neal, female    DOB: 1955/09/07, 61 y.o.   MRN: [id number]  HPI: Cindy Neal is a 61 y.o. female  Chief Complaint  Patient presents with  . Rib Injury    she fractured 2 ribs Friday night, tripped and fell into a van. Following up from UC. They gave her a small amount of Tramadol for her pain. Not much improved.    Patient presents for UC f/u after fracturing 2 right ribs 3 days ago. Tripped over a rock in the dark and fell into a van. X-ray confirmed fx's. No associated SOB. Given tramadol for pain, which is not helping much at this time.   Relevant past medical, surgical, family and social history reviewed and updated as indicated. Interim medical history since our last visit reviewed. Allergies and medications reviewed and updated.  Review of Systems  Constitutional: Negative.   HENT: Negative.   Eyes: Negative.   Respiratory: Negative.   Cardiovascular: Negative.   Gastrointestinal: Negative.   Genitourinary: Negative.   Musculoskeletal: Positive for arthralgias.  Neurological: Negative.   Psychiatric/Behavioral: Negative.     Per HPI unless specifically indicated above     Objective:    BP 134/82   Pulse 83   Temp 97.9 F (36.6 C)   Wt 172 lb (78 kg)   LMP  (LMP Unknown)   SpO2 95%   BMI 29.43 kg/m   Wt Readings from Last 3 Encounters:  07/28/16 172 lb (78 kg)  03/02/16 172 lb 8 oz (78.2 kg)  01/15/16 170 lb (77.1 kg)    Physical Exam  Constitutional: She is oriented to person, place, and time. She appears well-developed and well-nourished. No distress.  HENT:  Head: Atraumatic.  Eyes: Conjunctivae are normal. Pupils are equal, round, and reactive to light.  Neck: Normal range of motion. Neck supple.  Cardiovascular: Normal rate and normal heart sounds.   Pulmonary/Chest: Effort normal and  breath sounds normal. No respiratory distress.  Abdominal: Soft. Bowel sounds are normal. There is no tenderness.  Musculoskeletal: Normal range of motion.  Pain with ROM and movement of let arm, but ROM intact Significant TTP over left lower ribs in area of fx's  Neurological: She is alert and oriented to person, place, and time.  Skin: Skin is warm and dry.  Significant bruising over area of fracture left lower ribs  Psychiatric: She has a normal mood and affect. Her behavior is normal.  Nursing note and vitals reviewed.     Assessment & Plan:   Problem List Items Addressed This Visit    None    Visit Diagnoses    Closed fracture of multiple ribs with routine healing, unspecified laterality, subsequent encounter    -  Primary   Tramadol, flexeril, and meloxicam for pain relief. Rest, heat/ice, discussed expectations regarding duration of healing. Return precautions given.        Follow up plan: Return if symptoms worsen or fail to improve.

## 2016-08-12 ENCOUNTER — Other Ambulatory Visit: Payer: Self-pay | Admitting: Unknown Physician Specialty

## 2016-09-02 ENCOUNTER — Encounter: Payer: Self-pay | Admitting: Psychiatry

## 2016-09-02 ENCOUNTER — Ambulatory Visit (INDEPENDENT_AMBULATORY_CARE_PROVIDER_SITE_OTHER): Payer: 59 | Admitting: Psychiatry

## 2016-09-02 VITALS — BP 126/84 | HR 78 | Temp 98.5°F | Wt 168.2 lb

## 2016-09-02 DIAGNOSIS — F39 Unspecified mood [affective] disorder: Secondary | ICD-10-CM | POA: Diagnosis not present

## 2016-09-02 DIAGNOSIS — F411 Generalized anxiety disorder: Secondary | ICD-10-CM | POA: Diagnosis not present

## 2016-09-02 MED ORDER — MIRTAZAPINE 15 MG PO TABS: 15.0000 mg | ORAL_TABLET | Freq: Every day | ORAL | 1 refills | Status: DC

## 2016-09-02 MED ORDER — ARIPIPRAZOLE 10 MG PO TABS: 10.0000 mg | ORAL_TABLET | Freq: Every day | ORAL | 1 refills | Status: DC

## 2016-09-02 MED ORDER — LORAZEPAM 0.5 MG PO TABS: 0.5000 mg | ORAL_TABLET | Freq: Every day | ORAL | 1 refills | Status: DC

## 2016-09-02 MED ORDER — ZALEPLON 5 MG PO CAPS: 5.0000 mg | ORAL_CAPSULE | Freq: Every evening | ORAL | 1 refills | Status: DC | PRN

## 2016-09-02 NOTE — Progress Notes (Signed)
Psychiatric MD Progress Note  Patient Identification: Cindy Neal MRN:  [id number] Date of Evaluation:  09/02/2016 Referral Source: [facility]  Chief Complaint:   Chief Complaint    Follow-up; Medication Refill     Visit Diagnosis:    ICD-9-CM ICD-10-CM   1. Episodic mood disorder (HCC) 296.90 F39   2. Generalized anxiety disorder 300.02 F41.1     History of Present Illness:    Patient is a 61 year old widowed female who presented for follow up. She has history of bilateral cochlear implant and is hard of hearing. Patient reported that she continues to have problems with sleep. 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 Patient reported that she is unable to sleep at night and has been waking up at every hour. She reported that she takes Remeron at night. She takes lorazepam in the morning before going to work. We discussed about her medications in detail. She also takes Abilify in the morning. She currently denied having any suicidal ideations or plans. She reported that she has been compliant with her medication. Patient denied having any side effects at this time. She is receptive to her medication adjustment.   Patient reported that she wants to try some medication to help her with the sleep at this time. She has taken trazodone in the past which was not helpful and she was having dryness in her eyes. She does not want to take trazodone again. She has never tried a sleeping aid in the past.   Associated Signs/Symptoms: Depression Symptoms:  depressed mood, anxiety, (Hypo) Manic Symptoms:  Labiality of Mood, Anxiety Symptoms:  Excessive Worry, Psychotic Symptoms:  none PTSD Symptoms: Negative NA  Past Psychiatric History:  1995- [facility]- od on meds Admitted voluntarily in [place] - meds were off.   Previous Psychotropic Medications:   Trazodone neurontin zoloft Lithium- 10 years   Substance Abuse History in the last 12 months:  No.  Consequences of Substance Abuse: Negative NA  Past Medical History:  Past Medical History:  Diagnosis Date  . Anxiety   . Depression   . GERD (gastroesophageal reflux disease)   . Hyperlipidemia   . Osteoporosis     Past Surgical History:  Procedure Laterality Date  . CHOLECYSTECTOMY N/A 04/13/2015   Procedure: LAPAROSCOPIC CHOLECYSTECTOMY;  Surgeon: Gladis Riffle, MD;  Location: [facility] ORS;  Service: General;  Laterality: N/A;  . COCHLEAR IMPLANT Bilateral 10/15, 04/16  . JOINT REPLACEMENT Right    Hip  . MINOR EXCISION OF ORAL LESION N/A 08/30/2014   Procedure: MINOR EXCISION OF ORAL LESION;  Surgeon: Vernie Murders, MD;  Location: [facility];  Service: ENT;  Laterality: N/A;  HARD PALATE LESION  . WRIST SURGERY Right     Family Psychiatric History: she denied   Family History:  Family History  Problem Relation Age of Onset  . Cancer Mother        lung  . Hypertension Mother   . Hyperlipidemia Mother   . Lung disease Maternal Grandmother   . Emphysema Father   . Thyroid disease Father     Social History:   Social History   Social History  . Marital status: Married    Spouse name: N/A  . Number of children: N/A  . Years of education: N/A   Social History Main Topics  . Smoking status: Former Smoker    Quit date: 04/13/2006  .  Smokeless tobacco: Never Used  . Alcohol use No  . Drug use: No  . Sexual activity: No   Other Topics Concern  . None   Social History Narrative  . None    Additional Social History: husband passed away in April 2017.  Currently lives by herself. 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   Allergies:  No Known Allergies  Metabolic Disorder Labs: No results found for: HGBA1C, MPG No results found for: PROLACTIN Lab Results  Component Value Date   CHOL 219 (H) 01/15/2016   TRIG 210 (H) 01/15/2016   HDL  54 01/15/2016   LDLCALC 123 (H) 01/15/2016   LDLCALC 101 (H) 01/08/2015     Current Medications: Current Outpatient Prescriptions  Medication Sig Dispense Refill  . albuterol (PROVENTIL HFA;VENTOLIN HFA) 108 (90 Base) MCG/ACT inhaler Inhale 2 puffs into the lungs every 6 (six) hours as needed for wheezing or shortness of breath. 1 Inhaler 6  . ARIPiprazole (ABILIFY) 10 MG tablet Take 1 tablet (10 mg total) by mouth daily. AM 90 tablet 1  . aspirin EC 81 MG tablet Take 81 mg by mouth daily.    . cetirizine (ZYRTEC) 10 MG tablet Take 1 tablet (10 mg total) by mouth daily. 30 tablet 11  . cholecalciferol (VITAMIN D) 1000 units tablet Take 1,000 Units by mouth daily.    Marland Kitchen. ibuprofen (ADVIL,MOTRIN) 200 MG tablet Take 400 mg by mouth every 4 (four) hours as needed.    Marland Kitchen. levothyroxine (SYNTHROID, LEVOTHROID) 112 MCG tablet TAKE 1 TABLET (112 MCG TOTAL) BY MOUTH DAILY BEFORE BREAKFAST. 90 tablet 1  . LORazepam (ATIVAN) 0.5 MG tablet Take 1 tablet (0.5 mg total) by mouth daily. 30 tablet 1  . meloxicam (MOBIC) 15 MG tablet Take 1 tablet (15 mg total) by mouth daily. 30 tablet 0  . mirtazapine (REMERON) 15 MG tablet Take 1 tablet (15 mg total) by mouth at bedtime. 90 tablet 1  . montelukast (SINGULAIR) 10 MG tablet Take 1 tablet (10 mg total) by mouth at bedtime. 90 tablet 3  . omeprazole (PRILOSEC) 20 MG capsule Take 20 mg by mouth daily.     . raloxifene (EVISTA) 60 MG tablet Take 1 tablet (60 mg total) by mouth daily. 90 tablet 4  . vitamin C (ASCORBIC ACID) 500 MG tablet Take 500 mg by mouth daily.    . zaleplon (SONATA) 5 MG capsule Take 1 capsule (5 mg total) by mouth at bedtime as needed for sleep. 30 capsule 1   No current facility-administered medications for this visit.     Neurologic: Headache: No Seizure: No Paresthesias:No  Musculoskeletal: Strength & Muscle Tone: within normal limits Gait & Station: normal Patient leans: N/A  Psychiatric Specialty Exam: Review of Systems   Psychiatric/Behavioral: Positive for depression. The patient is nervous/anxious and has insomnia.   All other systems reviewed and are negative.   Blood pressure 126/84, pulse 78, temperature 98.5 F (36.9 C), temperature source Oral, weight 168 lb 3.2 oz (76.3 kg).Body mass index is 28.78 kg/m.  General Appearance: Casual  Eye Contact:  Fair  Speech:  Clear and Coherent  Volume:  Normal  Mood:  Anxious  Affect:  Congruent  Thought Process:  Coherent  Orientation:  Full (Time, Place, and Person)  Thought Content:  WDL  Suicidal Thoughts:  No  Homicidal Thoughts:  No  Memory:  Immediate;   Fair Recent;   Fair Remote;   Fair  Judgement:  Fair  Insight:  Fair  Psychomotor Activity:  Normal  Concentration:  Concentration: Fair and Attention Span: Fair  Recall:  [employer] of Knowledge:Fair  Language: Fair  Akathisia:  No  Handed:  Right  AIMS (if indicated):    Assets:  Communication Skills Desire for Improvement Physical Health Social Support  ADL's:  Intact  Cognition: WNL  Sleep:  poor    Treatment Plan Summary: Medication management   Discussed with patient about her medications. I will make an adjustment to her medication as follows  Abilify 10 mg daily Discontinue Wellbutrin Continue lorazepam 0.5 mg in the morning Continue  the Remeron 15 mg at bedtime I will start her on Sonata 5 mg at bedtime. Advised patient that she will not be able to continue the combination of lorazepam and Sonata for a long time and she agreed with the plan. Advised patient that if she notices worsening of symptoms she should call for an early appointment  Follow-up in 2 months or earlier depending on her symptoms    More than 50% of the time spent in psychoeducation, counseling and coordination of care.    This note was generated in part or whole with voice recognition software. Voice regonition is usually quite accurate but there are transcription errors that can and very often  do occur. I apologize for any typographical errors that were not detected and corrected.    Brandy Hale, MD 5/23/201810:22 AM

## 2016-11-02 ENCOUNTER — Encounter: Payer: Self-pay | Admitting: Psychiatry

## 2016-11-02 ENCOUNTER — Ambulatory Visit (INDEPENDENT_AMBULATORY_CARE_PROVIDER_SITE_OTHER): Payer: 59 | Admitting: Psychiatry

## 2016-11-02 VITALS — BP 139/87 | HR 75 | Ht 65.0 in | Wt 168.0 lb

## 2016-11-02 DIAGNOSIS — F39 Unspecified mood [affective] disorder: Secondary | ICD-10-CM

## 2016-11-02 DIAGNOSIS — F411 Generalized anxiety disorder: Secondary | ICD-10-CM

## 2016-11-02 MED ORDER — ARIPIPRAZOLE 10 MG PO TABS: 10.0000 mg | ORAL_TABLET | Freq: Every day | ORAL | 1 refills | Status: DC

## 2016-11-02 MED ORDER — MIRTAZAPINE 15 MG PO TABS: 15.0000 mg | ORAL_TABLET | Freq: Every day | ORAL | 1 refills | Status: DC

## 2016-11-02 MED ORDER — HYDROXYZINE HCL 10 MG PO TABS: 10.0000 mg | ORAL_TABLET | Freq: Two times a day (BID) | ORAL | 1 refills | Status: DC | PRN

## 2016-11-02 MED ORDER — LORAZEPAM 0.5 MG PO TABS: 0.5000 mg | ORAL_TABLET | Freq: Every day | ORAL | 1 refills | Status: DC

## 2016-11-02 NOTE — Progress Notes (Signed)
Psychiatric MD Progress Note  Patient Identification: Cindy Neal MRN:  [id number] Date of Evaluation:  11/02/2016 [facility]  Chief Complaint:    Visit Diagnosis:    ICD-10-CM   1. Episodic mood disorder (HCC) F39   2. Generalized anxiety disorder F41.1     History of Present Illness:    Patient is a 61 year old widowed female who presented for follow up. She has history of bilateral cochlear implant and is hard of hearing. Patient reported that she Has already stopped taking the Wellbutrin and the sleeping medication as they were not effective. She reported that she has been compliant with Abilify. She reported that she wants something for her anxiety as she feels anxious during the daytime. She takes lorazepam in the morning. She feels anxious from inside. She currently denied having any side effects of the medications. We discussed about in hydroxyzine and she was receptive to the medications. She appears calm and alert during the interview. She denied having any suicidal ideations or plans. She takes Remeron at night to help her with sleep.      Associated Signs/Symptoms: Depression Symptoms:  depressed mood, anxiety, (Hypo) Manic Symptoms:  Labiality of Mood, Anxiety Symptoms:  Excessive Worry, Psychotic Symptoms:  none PTSD Symptoms: Negative NA  Past Psychiatric History:  1995- [facility]- od on meds Admitted voluntarily in [place] - meds were off.   Previous Psychotropic Medications:  Trazodone neurontin zoloft Lithium- 10 years   Substance Abuse History in the last 12 months:  No.  Consequences of Substance Abuse: Negative NA  Past Medical History:  Past Medical History:  Diagnosis Date  . Anxiety   . Depression   . GERD (gastroesophageal reflux disease)   . Hyperlipidemia   . Osteoporosis     Past Surgical History:  Procedure Laterality Date  . CHOLECYSTECTOMY N/A 04/13/2015   Procedure: LAPAROSCOPIC  CHOLECYSTECTOMY;  Surgeon: Gladis Riffleatherine L Loflin, MD;  Location: [facility] ORS;  Service: General;  Laterality: N/A;  . COCHLEAR IMPLANT Bilateral 10/15, 04/16  . JOINT REPLACEMENT Right    Hip  . MINOR EXCISION OF ORAL LESION N/A 08/30/2014   Procedure: MINOR EXCISION OF ORAL LESION;  Surgeon: Vernie MurdersPaul Juengel, MD;  [facility];  Service: ENT;  Laterality: N/A;  HARD PALATE LESION  . WRIST SURGERY Right     Family Psychiatric History: she denied   Family History:  Family History  Problem Relation Age of Onset  . Cancer Mother        lung  . Hypertension Mother   . Hyperlipidemia Mother   . Lung disease Maternal Grandmother   . Emphysema Father   . Thyroid disease Father     Social History:   Social History   Social History  . Marital status: Married    Spouse name: N/A  . Number of children: N/A  . Years of education: N/A   Social History Main Topics  . Smoking status: Former Smoker    Quit date: 04/13/2006  . Smokeless tobacco: Never Used  . Alcohol use No  . Drug use: No  . Sexual activity: No   Other Topics Concern  . Not on file   Social History Narrative  . No narrative on file    Additional Social History: husband passed away in April 2017.  Currently lives by herself. 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   Allergies:  No Known Allergies  Metabolic Disorder Labs: No results found for: HGBA1C, MPG No  results found for: PROLACTIN Lab Results  Component Value Date   CHOL 219 (H) 01/15/2016   TRIG 210 (H) 01/15/2016   HDL 54 01/15/2016   LDLCALC 123 (H) 01/15/2016   LDLCALC 101 (H) 01/08/2015     Current Medications: Current Outpatient Prescriptions  Medication Sig Dispense Refill  . albuterol (PROVENTIL HFA;VENTOLIN HFA) 108 (90 Base) MCG/ACT inhaler Inhale 2 puffs into the lungs every 6 (six) hours as needed for wheezing or shortness of breath. 1 Inhaler 6  . ARIPiprazole (ABILIFY) 10 MG tablet Take 1 tablet (10 mg total) by mouth  daily. AM 90 tablet 1  . aspirin EC 81 MG tablet Take 81 mg by mouth daily.    . cetirizine (ZYRTEC) 10 MG tablet Take 1 tablet (10 mg total) by mouth daily. 30 tablet 11  . cholecalciferol (VITAMIN D) 1000 units tablet Take 1,000 Units by mouth daily.    Marland Kitchen ibuprofen (ADVIL,MOTRIN) 200 MG tablet Take 400 mg by mouth every 4 (four) hours as needed.    Marland Kitchen levothyroxine (SYNTHROID, LEVOTHROID) 112 MCG tablet TAKE 1 TABLET (112 MCG TOTAL) BY MOUTH DAILY BEFORE BREAKFAST. 90 tablet 1  . LORazepam (ATIVAN) 0.5 MG tablet Take 1 tablet (0.5 mg total) by mouth daily. 30 tablet 1  . meloxicam (MOBIC) 15 MG tablet Take 1 tablet (15 mg total) by mouth daily. 30 tablet 0  . mirtazapine (REMERON) 15 MG tablet Take 1 tablet (15 mg total) by mouth at bedtime. 90 tablet 1  . montelukast (SINGULAIR) 10 MG tablet Take 1 tablet (10 mg total) by mouth at bedtime. 90 tablet 3  . omeprazole (PRILOSEC) 20 MG capsule Take 20 mg by mouth daily.     . raloxifene (EVISTA) 60 MG tablet Take 1 tablet (60 mg total) by mouth daily. 90 tablet 4  . vitamin C (ASCORBIC ACID) 500 MG tablet Take 500 mg by mouth daily.    . zaleplon (SONATA) 5 MG capsule Take 1 capsule (5 mg total) by mouth at bedtime as needed for sleep. 30 capsule 1   No current facility-administered medications for this visit.     Neurologic: Headache: No Seizure: No Paresthesias:No  Musculoskeletal: Strength & Muscle Tone: within normal limits Gait & Station: normal Patient leans: N/A  Psychiatric Specialty Exam: Review of Systems  Psychiatric/Behavioral: Positive for depression. The patient is nervous/anxious and has insomnia.   All other systems reviewed and are negative.   There were no vitals taken for this visit.There is no height or weight on file to calculate BMI.  General Appearance: Casual  Eye Contact:  Fair  Speech:  Clear and Coherent  Volume:  Normal  Mood:  Anxious  Affect:  Congruent  Thought Process:  Coherent  Orientation:   Full (Time, Place, and Person)  Thought Content:  WDL  Suicidal Thoughts:  No  Homicidal Thoughts:  No  Memory:  Immediate;   Fair Recent;   Fair Remote;   Fair  Judgement:  Fair  Insight:  Fair  Psychomotor Activity:  Normal  Concentration:  Concentration: Fair and Attention Span: Fair  Recall:  [employer] of Knowledge:Fair  Language: Fair  Akathisia:  No  Handed:  Right  AIMS (if indicated):    Assets:  Communication Skills Desire for Improvement Physical Health Social Support  ADL's:  Intact  Cognition: WNL  Sleep:  poor    Treatment Plan Summary: Medication management   Discussed with patient about her medications. I will make an adjustment to her medication  as follows  Abilify 10 mg daily Continue lorazepam 0.5 mg in the morning Continue  the Remeron 15 mg at bedtime I will start her on hydroxyzine 10 mg by mouth twice a day for anxiety and she agreed with the plan.   Follow-up in 2 months or earlier depending on her symptoms    More than 50% of the time spent in psychoeducation, counseling and coordination of care.    This note was generated in part or whole with voice recognition software. Voice regonition is usually quite accurate but there are transcription errors that can and very often do occur. I apologize for any typographical errors that were not detected and corrected.    Brandy Hale, MD 7/23/201810:05 AM

## 2016-12-28 ENCOUNTER — Ambulatory Visit
Admission: RE | Admit: 2016-12-28 | Discharge: 2016-12-28 | Disposition: A | Discharge status: Home / Self Care | Payer: 59 | Source: Ambulatory Visit | Attending: Family Medicine | Admitting: Family Medicine

## 2016-12-28 ENCOUNTER — Ambulatory Visit (INDEPENDENT_AMBULATORY_CARE_PROVIDER_SITE_OTHER): Payer: 59 | Admitting: Family Medicine

## 2016-12-28 ENCOUNTER — Encounter: Payer: Self-pay | Admitting: Family Medicine

## 2016-12-28 ENCOUNTER — Telehealth: Payer: Self-pay

## 2016-12-28 ENCOUNTER — Telehealth: Payer: Self-pay | Admitting: Family Medicine

## 2016-12-28 VITALS — BP 129/89 | HR 78 | Temp 97.9°F | Wt 168.0 lb

## 2016-12-28 DIAGNOSIS — R05 Cough: Secondary | ICD-10-CM | POA: Diagnosis not present

## 2016-12-28 DIAGNOSIS — R059 Cough, unspecified: Secondary | ICD-10-CM

## 2016-12-28 DIAGNOSIS — R221 Localized swelling, mass and lump, neck: Secondary | ICD-10-CM

## 2016-12-28 DIAGNOSIS — R590 Localized enlarged lymph nodes: Secondary | ICD-10-CM | POA: Diagnosis not present

## 2016-12-28 MED ORDER — ALBUTEROL SULFATE HFA 108 (90 BASE) MCG/ACT IN AERS: 1.0000 | INHALATION_SPRAY | Freq: Four times a day (QID) | RESPIRATORY_TRACT | 6 refills | Status: AC | PRN

## 2016-12-28 MED ORDER — BUDESONIDE-FORMOTEROL FUMARATE 160-4.5 MCG/ACT IN AERO: 2.0000 | INHALATION_SPRAY | Freq: Two times a day (BID) | RESPIRATORY_TRACT | 3 refills | Status: DC

## 2016-12-28 MED ORDER — ALBUTEROL SULFATE HFA 108 (90 BASE) MCG/ACT IN AERS: 2.0000 | INHALATION_SPRAY | Freq: Four times a day (QID) | RESPIRATORY_TRACT | 6 refills | Status: DC | PRN

## 2016-12-28 NOTE — Telephone Encounter (Addendum)
Insurance requests rx to be changed to [employer] [facility]

## 2016-12-28 NOTE — Telephone Encounter (Signed)
Error - called pt and documented in another encounter

## 2016-12-28 NOTE — Telephone Encounter (Signed)
Ventolin switched to proair, and pt called and informed that CXR was unchanged from previous, no new or acute findings. Will go ahead and get an ultrasound of her left neck and go from there regarding her superficial edema in that area off and on.

## 2016-12-28 NOTE — Progress Notes (Signed)
BP 129/89   Pulse 78   Temp 97.9 F (36.6 C)   Wt 168 lb (76.2 kg)   LMP  (LMP Unknown)   SpO2 97%   BMI 27.96 kg/m    Subjective:    Patient ID: Cindy Neal, female    DOB: 1956/04/10, 61 y.o.   MRN: [id number]  HPI: BYANCA KASPER is a 61 y.o. female  Chief Complaint  Patient presents with  . Shoulder Swelling    left x 4 months, no known injury, no pain. Just swells up.  . Cough    comes and goes for the last couple months, dry cough   Patient presents today for intermittent swelling above left clavicle x 4 months. Denies tenderness in the area, difficulty breathing, skin changes. No recent infections, fevers, chills, sweats.   Also still having a chronic dry cough. Albuterol helps when she uses it. Denies SOB, congestion, wheezing. Has tried many cough remedies in the past with no relief. Former smoker, quit about 10 years ago.   Past Medical History:  Diagnosis Date  . Anxiety   . Depression   . GERD (gastroesophageal reflux disease)   . Hyperlipidemia   . Osteoporosis    Social History   Social History  . Marital status: Married    Spouse name: N/A  . Number of children: N/A  . Years of education: N/A   Occupational History  . Not on file.   Social History Main Topics  . Smoking status: Former Smoker    Quit date: 04/13/2006  . Smokeless tobacco: Never Used  . Alcohol use No  . Drug use: No  . Sexual activity: No   Other Topics Concern  . Not on file   Social History Narrative  . No narrative on file    Relevant past medical, surgical, family and social history reviewed and updated as indicated. Interim medical history since our last visit reviewed. Allergies and medications reviewed and updated.  Review of Systems  Constitutional: Negative for diaphoresis, fever and unexpected weight change.  HENT: Negative.   Respiratory: Positive for cough.   Cardiovascular: Negative.   Gastrointestinal: Negative.   Genitourinary: Negative.     Musculoskeletal: Negative.   Skin:       Superficial swelling left supraclavicular area  Neurological: Negative.   Psychiatric/Behavioral: Negative.    Per HPI unless specifically indicated above     Objective:    BP 129/89   Pulse 78   Temp 97.9 F (36.6 C)   Wt 168 lb (76.2 kg)   LMP  (LMP Unknown)   SpO2 97%   BMI 27.96 kg/m   Wt Readings from Last 3 Encounters:  12/28/16 168 lb (76.2 kg)  07/28/16 172 lb (78 kg)  03/02/16 172 lb 8 oz (78.2 kg)    Physical Exam  Constitutional: She is oriented to person, place, and time. She appears well-developed and well-nourished. No distress.  HENT:  Head: Atraumatic.  Eyes: Pupils are equal, round, and reactive to light. Conjunctivae are normal.  Cardiovascular: Normal rate and normal heart sounds.   Pulmonary/Chest: Effort normal and breath sounds normal. No respiratory distress. She has no wheezes.  Musculoskeletal: Normal range of motion.  Lymphadenopathy:  Mild superficial edema left supraclavicular region. Possible palpable lymph node present, but feels soft and mobile  Neurological: She is alert and oriented to person, place, and time.  Skin: Skin is warm and dry. No erythema.  Psychiatric: She has a normal mood and affect.  Her behavior is normal.  Nursing note and vitals reviewed.     Assessment & Plan:   Problem List Items Addressed This Visit    None    Visit Diagnoses    Supraclavicular adenopathy    -  Primary   Mild appreciable soft tissue swelling in left supraclavicular region.No evidence of recent infection. Will get u/s of area for further evaluation   Cough       Chronic, will start symbicort in addition to albuterol prn. Will do PFTs if no relief with this regimen   Relevant Orders   DG Chest 2 View (Completed)       Follow up plan: Return if symptoms worsen or fail to improve.

## 2016-12-30 NOTE — Patient Instructions (Signed)
Follow up as needed

## 2017-01-01 ENCOUNTER — Ambulatory Visit
Admission: RE | Admit: 2017-01-01 | Discharge: 2017-01-01 | Disposition: A | Discharge status: Home / Self Care | Payer: 59 | Source: Ambulatory Visit | Attending: Family Medicine | Admitting: Family Medicine

## 2017-01-01 DIAGNOSIS — R221 Localized swelling, mass and lump, neck: Secondary | ICD-10-CM

## 2017-01-04 ENCOUNTER — Ambulatory Visit: Payer: 59 | Admitting: Psychiatry

## 2017-01-06 ENCOUNTER — Other Ambulatory Visit: Payer: Self-pay | Admitting: Psychiatry

## 2017-01-17 ENCOUNTER — Other Ambulatory Visit: Payer: Self-pay | Admitting: Psychiatry

## 2017-01-18 ENCOUNTER — Encounter: Payer: Self-pay | Admitting: Psychiatry

## 2017-01-18 ENCOUNTER — Ambulatory Visit (INDEPENDENT_AMBULATORY_CARE_PROVIDER_SITE_OTHER): Payer: 59 | Admitting: Psychiatry

## 2017-01-18 VITALS — BP 168/112 | HR 108 | Temp 99.3°F | Wt 160.6 lb

## 2017-01-18 DIAGNOSIS — F39 Unspecified mood [affective] disorder: Secondary | ICD-10-CM | POA: Diagnosis not present

## 2017-01-18 DIAGNOSIS — F411 Generalized anxiety disorder: Secondary | ICD-10-CM

## 2017-01-18 MED ORDER — ARIPIPRAZOLE 10 MG PO TABS: 10.0000 mg | ORAL_TABLET | Freq: Every day | ORAL | 1 refills | Status: DC

## 2017-01-18 MED ORDER — LORAZEPAM 0.5 MG PO TABS: 0.5000 mg | ORAL_TABLET | Freq: Every day | ORAL | 2 refills | Status: DC

## 2017-01-18 MED ORDER — ZALEPLON 5 MG PO CAPS: 5.0000 mg | ORAL_CAPSULE | Freq: Every evening | ORAL | 0 refills | Status: DC | PRN

## 2017-01-18 MED ORDER — MIRTAZAPINE 15 MG PO TABS: 15.0000 mg | ORAL_TABLET | Freq: Every day | ORAL | 1 refills | Status: DC
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
## 2017-01-19 ENCOUNTER — Encounter: Payer: Self-pay | Admitting: Unknown Physician Specialty

## 2017-01-19 ENCOUNTER — Ambulatory Visit (INDEPENDENT_AMBULATORY_CARE_PROVIDER_SITE_OTHER): Payer: 59 | Admitting: Unknown Physician Specialty

## 2017-01-19 ENCOUNTER — Telehealth: Payer: Self-pay | Admitting: Family Medicine

## 2017-01-19 VITALS — BP 134/83 | HR 93 | Temp 98.5°F | Ht 63.8 in | Wt 161.6 lb

## 2017-01-19 DIAGNOSIS — Z0000 Encounter for general adult medical examination without abnormal findings: Secondary | ICD-10-CM

## 2017-01-19 DIAGNOSIS — Z23 Encounter for immunization: Secondary | ICD-10-CM

## 2017-01-19 DIAGNOSIS — E038 Other specified hypothyroidism: Secondary | ICD-10-CM

## 2017-01-19 MED ORDER — MONTELUKAST SODIUM 10 MG PO TABS: 10.0000 mg | ORAL_TABLET | Freq: Every day | ORAL | 3 refills | Status: DC

## 2017-01-19 MED ORDER — RALOXIFENE HCL 60 MG PO TABS: 60.0000 mg | ORAL_TABLET | Freq: Every day | ORAL | 4 refills | Status: AC

## 2017-01-19 MED ORDER — LEVOTHYROXINE SODIUM 112 MCG PO TABS: 112.0000 ug | ORAL_TABLET | Freq: Every day | ORAL | 1 refills | Status: DC

## 2017-01-19 NOTE — Patient Instructions (Addendum)
Influenza (Flu) Vaccine (Inactivated or Recombinant): What You Need to Know 1. Why get vaccinated? Influenza ("flu") is a contagious disease that spreads around the [place] every year, usually between October and May. Flu is caused by influenza viruses, and is spread mainly by coughing, sneezing, and close contact. Anyone can get flu. Flu strikes suddenly and can last several days. Symptoms vary by age, but can include:  fever/chills  sore throat  muscle aches  fatigue  cough  headache  runny or stuffy nose  Flu can also lead to pneumonia and blood infections, and cause diarrhea and seizures in children. If you have a medical condition, such as heart or lung disease, flu can make it worse. Flu is more dangerous for some people. Infants and young children, people 23 years of age and older, pregnant women, and people with certain health conditions or a weakened immune system are at greatest risk. Each year thousands of people in the [place] die from flu, and many more are hospitalized. Flu vaccine can:  keep you from getting flu,  make flu less severe if you do get it, and  keep you from spreading flu to your family and other people. 2. Inactivated and recombinant flu vaccines A dose of flu vaccine is recommended every flu season. Children 6 months through 91 years of age may need two doses during the same flu season. Everyone else needs only one dose each flu season. Some inactivated flu vaccines contain a very small amount of a mercury-based preservative called thimerosal. Studies have not shown thimerosal in vaccines to be harmful, but flu vaccines that do not contain thimerosal are available. There is no live flu virus in flu shots. They cannot cause the flu. There are many flu viruses, and they are always changing. Each year a new flu vaccine is made to protect against three or four viruses that are likely to cause disease in the upcoming flu season. But even when the  vaccine doesn't exactly match these viruses, it may still provide some protection. Flu vaccine cannot prevent:  flu that is caused by a virus not covered by the vaccine, or  illnesses that look like flu but are not.  It takes about 2 weeks for protection to develop after vaccination, and protection lasts through the flu season. 3. Some people should not get this vaccine Tell the person who is giving you the vaccine:  If you have any severe, life-threatening allergies. If you ever had a life-threatening allergic reaction after a dose of flu vaccine, or have a severe allergy to any part of this vaccine, you may be advised not to get vaccinated. Most, but not all, types of flu vaccine contain a small amount of egg protein.  If you ever had Guillain-Barr Syndrome (also called GBS). Some people with a history of GBS should not get this vaccine. This should be discussed with your doctor.  If you are not feeling well. It is usually okay to get flu vaccine when you have a mild illness, but you might be asked to come back when you feel better.  4. Risks of a vaccine reaction With any medicine, including vaccines, there is a chance of reactions. These are usually mild and go away on their own, but serious reactions are also possible. Most people who get a flu shot do not have any problems with it. Minor problems following a flu shot include:  soreness, redness, or swelling where the shot was given  hoarseness  sore,  red or itchy eyes  cough  fever  aches  headache  itching  fatigue  If these problems occur, they usually begin soon after the shot and last 1 or 2 days. More serious problems following a flu shot can include the following:  There may be a small increased risk of Guillain-Barre Syndrome (GBS) after inactivated flu vaccine. This risk has been estimated at 1 or 2 additional cases per million people vaccinated. This is much lower than the risk of severe complications from  flu, which can be prevented by flu vaccine.  Young children who get the flu shot along with pneumococcal vaccine (PCV13) and/or DTaP vaccine at the same time might be slightly more likely to have a seizure caused by fever. Ask your doctor for more information. Tell your doctor if a child who is getting flu vaccine has ever had a seizure.  Problems that could happen after any injected vaccine:  People sometimes faint after a medical procedure, including vaccination. Sitting or lying down for about 15 minutes can help prevent fainting, and injuries caused by a fall. Tell your doctor if you feel dizzy, or have vision changes or ringing in the ears.  Some people get severe pain in the shoulder and have difficulty moving the arm where a shot was given. This happens very rarely.  Any medication can cause a severe allergic reaction. Such reactions from a vaccine are very rare, estimated at about 1 in a million doses, and would happen within a few minutes to a few hours after the vaccination. As with any medicine, there is a very remote chance of a vaccine causing a serious injury or death. The safety of vaccines is always being monitored. For more information, visit: http://www.aguilar.org/ 5. What if there is a serious reaction? What should I look for? Look for anything that concerns you, such as signs of a severe allergic reaction, very high fever, or unusual behavior. Signs of a severe allergic reaction can include hives, swelling of the face and throat, difficulty breathing, a fast heartbeat, dizziness, and weakness. These would start a few minutes to a few hours after the vaccination. What should I do?  If you think it is a severe allergic reaction or other emergency that can't wait, call 9-1-1 and get the person to the nearest hospital. Otherwise, call your doctor.  Reactions should be reported to the Vaccine Adverse Event Reporting System (VAERS). Your doctor should file this report, or you  can do it yourself through the VAERS web site at www.vaers.SamedayNews.es, or by calling 6094730752. ? VAERS does not give medical advice. 6. The National Vaccine Injury Compensation Program The [employer] Vaccine Injury Compensation Program (VICP) is a federal program that was created to compensate people who may have been injured by certain vaccines. Persons who believe they may have been injured by a vaccine can learn about the program and about filing a claim by calling 458-267-6070 or visiting the [place] website at GoldCloset.com.ee. There is a time limit to file a claim for compensation. 7. How can I learn more?  Ask your healthcare provider. He or she can give you the vaccine package insert or suggest other sources of information.  Call your local or state health department.  Contact the Centers for Disease Control and Prevention (CDC): ? Call (540)164-9661 (1-800-CDC-INFO) or ? Visit CDC's website at https://gibson.com/ Vaccine Information Statement, Inactivated Influenza Vaccine (11/17/2013) This information is not intended to replace advice given to you by your health care provider. Make sure  you discuss any questions you have with your health care provider. Document Released: 01/22/2006 Document Revised: 12/19/2015 Document Reviewed: 12/19/2015 Elsevier Interactive Patient Education  2017 [place] Years, Female Preventive care refers to lifestyle choices and visits with your health care provider that can promote health and wellness. What does preventive care include?  A yearly physical exam. This is also called an annual well check.  Dental exams once or twice a year.  Routine eye exams. Ask your health care provider how often you should have your eyes checked.  Personal lifestyle choices, including: ? Daily care of your teeth and gums. ? Regular physical activity. ? Eating a healthy diet. ? Avoiding tobacco and drug use. ? Limiting  alcohol use. ? Practicing safe sex. ? Taking low-dose aspirin daily starting at age 52. ? Taking vitamin and mineral supplements as recommended by your health care provider. What happens during an annual well check? The services and screenings done by your health care provider during your annual well check will depend on your age, overall health, lifestyle risk factors, and family history of disease. Counseling Your health care provider may ask you questions about your:  Alcohol use.  Tobacco use.  Drug use.  Emotional well-being.  Home and relationship well-being.  Sexual activity.  Eating habits.  Work and work Statistician.  Method of birth control.  Menstrual cycle.  Pregnancy history.  Screening You may have the following tests or measurements:  Height, weight, and BMI.  Blood pressure.  Lipid and cholesterol levels. These may be checked every 5 years, or more frequently if you are over 82 years old.  Skin check.  Lung cancer screening. You may have this screening every year starting at age 81 if you have a 30-pack-year history of smoking and currently smoke or have quit within the past 15 years.  Fecal occult blood test (FOBT) of the stool. You may have this test every year starting at age 61.  Flexible sigmoidoscopy or colonoscopy. You may have a sigmoidoscopy every 5 years or a colonoscopy every 10 years starting at age 41.  Hepatitis C blood test.  Hepatitis B blood test.  Sexually transmitted disease (STD) testing.  Diabetes screening. This is done by checking your blood sugar (glucose) after you have not eaten for a while (fasting). You may have this done every 1-3 years.  Mammogram. This may be done every 1-2 years. Talk to your health care provider about when you should start having regular mammograms. This may depend on whether you have a family history of breast cancer.  BRCA-related cancer screening. This may be done if you have a family  history of breast, ovarian, tubal, or peritoneal cancers.  Pelvic exam and Pap test. This may be done every 3 years starting at age 89. Starting at age 86, this may be done every 5 years if you have a Pap test in combination with an HPV test.  Bone density scan. This is done to screen for osteoporosis. You may have this scan if you are at high risk for osteoporosis.  Discuss your test results, treatment options, and if necessary, the need for more tests with your health care provider. Vaccines Your health care provider may recommend certain vaccines, such as:  Influenza vaccine. This is recommended every year.  Tetanus, diphtheria, and acellular pertussis (Tdap, Td) vaccine. You may need a Td booster every 10 years.  Varicella vaccine. You may need this if you have not been vaccinated.  Zoster vaccine. You may need this after age 55.  Measles, mumps, and rubella (MMR) vaccine. You may need at least one dose of MMR if you were born in 1957 or later. You may also need a second dose.  Pneumococcal 13-valent conjugate (PCV13) vaccine. You may need this if you have certain conditions and were not previously vaccinated.  Pneumococcal polysaccharide (PPSV23) vaccine. You may need one or two doses if you smoke cigarettes or if you have certain conditions.  Meningococcal vaccine. You may need this if you have certain conditions.  Hepatitis A vaccine. You may need this if you have certain conditions or if you travel or work in places where you may be exposed to hepatitis A.  Hepatitis B vaccine. You may need this if you have certain conditions or if you travel or work in places where you may be exposed to hepatitis B.  Haemophilus influenzae type b (Hib) vaccine. You may need this if you have certain conditions.  Talk to your health care provider about which screenings and vaccines you need and how often you need them. This information is not intended to replace advice given to you by your  health care provider. Make sure you discuss any questions you have with your health care provider. Document Released: 04/26/2015 Document Revised: 12/18/2015 Document Reviewed: 01/29/2015 Elsevier Interactive Patient Education  2017 [employer].

## 2017-01-19 NOTE — Progress Notes (Signed)
BP 134/83   Pulse 93   Temp 98.5 F (36.9 C)   Ht 5' 3.8" (1.621 m)   Wt 161 lb 9.6 oz (73.3 kg)   LMP  (LMP Unknown)   SpO2 96%   BMI 27.91 kg/m    Subjective:    Patient ID: Cindy Neal, female    DOB: 11-08-1955, 61 y.o.   MRN: [id number]  HPI: Cindy Neal is a 61 y.o. female  Chief Complaint  Patient presents with  . Annual Exam   Hypothyroid Energy and weight are stable  Depression screen [facility] 2/9 01/19/2017 01/15/2016 08/05/2015  Decreased Interest 0 0 0  Down, Depressed, Hopeless 0 0 2  PHQ - 2 Score 0 0 2  Altered sleeping Tired, decreased energy 0 1 3  Change in appetite 0 1 3  Feeling bad or failure about yourself  0 0 0  Trouble concentrating 0 0 2  Moving slowly or fidgety/restless 0 0 0  Suicidal thoughts 0 0 0  PHQ-9 Score Allergies Some congestion to to fall ragweed season.    Relevant past medical, surgical, family and social history reviewed and updated as indicated. Interim medical history since our last visit reviewed. Allergies and medications reviewed and updated.  Review of Systems  Per HPI unless specifically indicated above     Objective:    BP 134/83   Pulse 93   Temp 98.5 F (36.9 C)   Ht 5' 3.8" (1.621 m)   Wt 161 lb 9.6 oz (73.3 kg)   LMP  (LMP Unknown)   SpO2 96%   BMI 27.91 kg/m   Wt Readings from Last 3 Encounters:  01/19/17 161 lb 9.6 oz (73.3 kg)  12/28/16 168 lb (76.2 kg)  07/28/16 172 lb (78 kg)    Physical Exam  Constitutional: She is oriented to person, place, and time. She appears well-developed and well-nourished. No distress.  HENT:  Head: Normocephalic and atraumatic.  Eyes: Conjunctivae and lids are normal. Right eye exhibits no discharge. Left eye exhibits no discharge. No scleral icterus.  Neck: Normal range of motion. Neck supple. No JVD present. Carotid bruit is not present.  Cardiovascular: Normal rate, regular rhythm and normal heart sounds.   Pulmonary/Chest: Effort normal  and breath sounds normal.  Abdominal: Normal appearance. There is no splenomegaly or hepatomegaly.  Musculoskeletal: Normal range of motion.  Neurological: She is alert and oriented to person, place, and time.  Skin: Skin is warm, dry and intact. No rash noted. No pallor.  Psychiatric: She has a normal mood and affect. Her behavior is normal. Judgment and thought content normal.   Refusing breast exam  Results for orders placed or performed in visit on 01/15/16  CBC with Differential/Platelet  Result Value Ref Range   WBC 6.7 3.4 - 10.8 x10E3/uL   RBC 4.49 3.77 - 5.28 x10E6/uL   Hemoglobin 13.2 11.1 - 15.9 g/dL   Hematocrit 40.9 81.1 - 46.6 %   MCV 88 79 - 97 fL   MCH 29.4 26.6 - 33.0 pg   MCHC 33.2 31.5 - 35.7 g/dL   RDW 91.4 78.2 - 95.6 %   Platelets 263 150 - 379 x10E3/uL   Neutrophils 60 Not Estab. %   Lymphs 29 Not Estab. %   Monocytes 8 Not Estab. %   Eos 3 Not Estab. %   Basos 0 Not Estab. %   Neutrophils Absolute 4.0 1.4 - 7.0 x10E3/uL  Lymphocytes Absolute 1.9 0.7 - 3.1 x10E3/uL   Monocytes Absolute 0.5 0.1 - 0.9 x10E3/uL   EOS (ABSOLUTE) 0.2 0.0 - 0.4 x10E3/uL   Basophils Absolute 0.0 0.0 - 0.2 x10E3/uL   Immature Granulocytes 0 Not Estab. %   Immature Grans (Abs) 0.0 0.0 - 0.1 x10E3/uL  Comprehensive metabolic panel  Result Value Ref Range   Glucose 75 65 - 99 mg/dL   BUN 8 8 - 27 mg/dL   Creatinine, Ser 1.61 (L) 0.57 - 1.00 mg/dL   GFR calc non Af Amer 102 >59 mL/min/1.73   GFR calc Af Amer 117 >59 mL/min/1.73   BUN/Creatinine Ratio 15 12 - 28   Sodium 144 134 - 144 mmol/L   Potassium 4.3 3.5 - 5.2 mmol/L   Chloride 104 96 - 106 mmol/L   CO2 25 18 - 29 mmol/L   Calcium 9.1 8.7 - 10.3 mg/dL   Total Protein 6.4 6.0 - 8.5 g/dL   Albumin 3.9 3.6 - 4.8 g/dL   Globulin, Total 2.5 1.5 - 4.5 g/dL   Albumin/Globulin Ratio 1.6 1.2 - 2.2   Bilirubin Total 0.3 0.0 - 1.2 mg/dL   Alkaline Phosphatase 78 39 - 117 IU/L   AST 14 0 - 40 IU/L   ALT 17 0 - 32 IU/L    Lipid Panel w/o Chol/HDL Ratio  Result Value Ref Range   Cholesterol, Total 219 (H) 100 - 199 mg/dL   Triglycerides 096 (H) 0 - 149 mg/dL   HDL 54 >04 mg/dL   VLDL Cholesterol Cal 42 (H) 5 - 40 mg/dL   LDL Calculated 540 (H) 0 - 99 mg/dL  TSH  Result Value Ref Range   TSH 2.960 0.450 - 4.500 uIU/mL  IGP, Aptima HPV, rfx 16/18,45  Result Value Ref Range   DIAGNOSIS: Comment    Specimen adequacy: Comment    Clinician Provided ICD10 Comment    Performed by: Comment    Electronically signed by: Comment    PAP Smear Comment .    Note: Comment    Test Methodology Comment    HPV Aptima Negative Negative      Assessment & Plan:   Problem List Items Addressed This Visit      Unprioritized   Hypothyroid   Relevant Medications   levothyroxine (SYNTHROID, LEVOTHROID) 112 MCG tablet   Other Relevant Orders   TSH    Other Visit Diagnoses    Need for influenza vaccination    -  Primary   Relevant Orders   Flu Vaccine QUAD 36+ mos IM (Completed)   Routine general medical examination at a health care facility       Relevant Orders   Comprehensive metabolic panel   CBC with Differential/Platelet   Lipid Panel w/o Chol/HDL Ratio      She will call for mammogram  Follow up plan: Return in about 1 year (around 01/19/2018).

## 2017-01-19 NOTE — Telephone Encounter (Signed)
Routing to provider  

## 2017-01-19 NOTE — Telephone Encounter (Signed)
Cindy Neal from [facility] on [address] Nicholes Rough requesting approval to switch lvothyroxine medication to another manufacturer.   Please Advise.  Thank you

## 2017-01-20 LAB — CBC WITH DIFFERENTIAL/PLATELET
BASOS ABS: 0 10*3/uL (ref 0.0–0.2)
Basos: 0 %
EOS (ABSOLUTE): 0.2 10*3/uL (ref 0.0–0.4)
EOS: 2 %
HEMATOCRIT: 42.7 % (ref 34.0–46.6)
HEMOGLOBIN: 13.9 g/dL (ref 11.1–15.9)
IMMATURE GRANULOCYTES: 0 %
Immature Grans (Abs): 0 10*3/uL (ref 0.0–0.1)
Lymphocytes Absolute: 1.9 10*3/uL (ref 0.7–3.1)
Lymphs: 18 %
MCH: 29.5 pg (ref 26.6–33.0)
MCHC: 32.6 g/dL (ref 31.5–35.7)
MCV: 91 fL (ref 79–97)
MONOCYTES: 8 %
Monocytes Absolute: 0.8 10*3/uL (ref 0.1–0.9)
NEUTROS PCT: 72 %
Neutrophils Absolute: 7.4 10*3/uL — ABNORMAL HIGH (ref 1.4–7.0)
Platelets: 261 10*3/uL (ref 150–379)
RBC: 4.71 x10E6/uL (ref 3.77–5.28)
RDW: 13.5 % (ref 12.3–15.4)
WBC: 10.3 10*3/uL (ref 3.4–10.8)

## 2017-01-20 LAB — COMPREHENSIVE METABOLIC PANEL
ALK PHOS: 81 IU/L (ref 39–117)
ALT: 27 IU/L (ref 0–32)
AST: 20 IU/L (ref 0–40)
Albumin/Globulin Ratio: 1.9 (ref 1.2–2.2)
Albumin: 4.2 g/dL (ref 3.6–4.8)
BUN/Creatinine Ratio: 15 (ref 12–28)
BUN: 10 mg/dL (ref 8–27)
Bilirubin Total: 0.3 mg/dL (ref 0.0–1.2)
CO2: 21 mmol/L (ref 20–29)
Calcium: 9.5 mg/dL (ref 8.7–10.3)
Chloride: 107 mmol/L — ABNORMAL HIGH (ref 96–106)
Creatinine, Ser: 0.67 mg/dL (ref 0.57–1.00)
GFR calc Af Amer: 109 mL/min/{1.73_m2} (ref >59)
GFR calc non Af Amer: 95 mL/min/{1.73_m2} (ref >59)
GLUCOSE: 103 mg/dL — AB (ref 65–99)
Globulin, Total: 2.2 g/dL (ref 1.5–4.5)
Potassium: 3.8 mmol/L (ref 3.5–5.2)
Sodium: 143 mmol/L (ref 134–144)
Total Protein: 6.4 g/dL (ref 6.0–8.5)

## 2017-01-20 LAB — LIPID PANEL W/O CHOL/HDL RATIO
CHOLESTEROL TOTAL: 180 mg/dL (ref 100–199)
HDL: 42 mg/dL (ref >39)
LDL Calculated: 90 mg/dL (ref 0–99)
TRIGLYCERIDES: 241 mg/dL — AB (ref 0–149)
VLDL CHOLESTEROL CAL: 48 mg/dL — AB (ref 5–40)

## 2017-01-20 LAB — TSH: TSH: 5.03 u[IU]/mL — ABNORMAL HIGH (ref 0.450–4.500)

## 2017-01-20 NOTE — Telephone Encounter (Signed)
yes

## 2017-01-20 NOTE — Telephone Encounter (Signed)
Pharmacy notified.

## 2017-02-03 ENCOUNTER — Other Ambulatory Visit: Payer: Self-pay | Admitting: Unknown Physician Specialty

## 2017-02-04 ENCOUNTER — Other Ambulatory Visit: Payer: Self-pay | Admitting: Unknown Physician Specialty

## 2017-02-16 ENCOUNTER — Other Ambulatory Visit: Payer: Self-pay

## 2017-02-16 MED ORDER — BUDESONIDE-FORMOTEROL FUMARATE 160-4.5 MCG/ACT IN AERO
2.0000 | INHALATION_SPRAY | Freq: Two times a day (BID) | RESPIRATORY_TRACT | 3 refills | Status: DC
Start: 2017-02-16 — End: 2017-03-01

## 2017-02-16 NOTE — Telephone Encounter (Signed)
Pharmacy would like Symbicort to be changed to a 90 day supply. Appt on 01/24/18.

## 2017-02-22 ENCOUNTER — Ambulatory Visit: Payer: 59 | Admitting: Psychiatry

## 2017-03-01 ENCOUNTER — Telehealth: Payer: Self-pay

## 2017-03-01 MED ORDER — BUDESONIDE-FORMOTEROL FUMARATE 160-4.5 MCG/ACT IN AERO: 2.0000 | INHALATION_SPRAY | Freq: Two times a day (BID) | RESPIRATORY_TRACT | 3 refills | Status: DC

## 2017-03-01 NOTE — Telephone Encounter (Signed)
Pharmacy sent a fax stating that the patient's insurance mandates that the symbicort RX be changed to a 90 day prescription. Please advise.

## 2017-03-22 ENCOUNTER — Ambulatory Visit: Payer: 59 | Admitting: Psychiatry

## 2017-04-19 ENCOUNTER — Ambulatory Visit: Payer: Self-pay

## 2017-04-19 ENCOUNTER — Encounter: Payer: Self-pay | Admitting: Family Medicine

## 2017-04-19 ENCOUNTER — Ambulatory Visit: Payer: Self-pay | Admitting: *Deleted

## 2017-04-19 ENCOUNTER — Ambulatory Visit (INDEPENDENT_AMBULATORY_CARE_PROVIDER_SITE_OTHER): Payer: BLUE CROSS/BLUE SHIELD | Admitting: Family Medicine

## 2017-04-19 VITALS — BP 101/67 | HR 78 | Temp 97.8°F | Wt 148.7 lb

## 2017-04-19 DIAGNOSIS — J069 Acute upper respiratory infection, unspecified: Secondary | ICD-10-CM

## 2017-04-19 DIAGNOSIS — B9789 Other viral agents as the cause of diseases classified elsewhere: Secondary | ICD-10-CM

## 2017-04-19 DIAGNOSIS — B370 Candidal stomatitis: Secondary | ICD-10-CM

## 2017-04-19 MED ORDER — NYSTATIN 100000 UNIT/ML MT SUSP: 5.0000 mL | Freq: Four times a day (QID) | OROMUCOSAL | 3 refills | Status: AC

## 2017-04-19 MED ORDER — HYDROCOD POLST-CPM POLST ER 10-8 MG/5ML PO SUER: 5.0000 mL | Freq: Two times a day (BID) | ORAL | 0 refills | Status: DC | PRN

## 2017-04-19 MED ORDER — PREDNISONE 10 MG PO TABS: ORAL_TABLET | ORAL | 0 refills | Status: DC

## 2017-04-19 NOTE — Telephone Encounter (Signed)
Pt. Reports she did call EMS as previous nurse recommended and when they came, "they told me to follow up with my doctor."

## 2017-04-19 NOTE — Telephone Encounter (Signed)
FYI. Pt has been scheduled to see Fleet ContrasRachel today.

## 2017-04-19 NOTE — Telephone Encounter (Signed)
   Reason for Disposition . SEVERE coughing spells (e.g., whooping sound after coughing, vomiting after coughing)  Answer Assessment - Initial Assessment Questions 1. ONSET: "When did the cough begin?"      Started Friday 2. SEVERITY: "How bad is the cough today?"      Severe 3. RESPIRATORY DISTRESS: "Describe your breathing."      Short of breath when coughing 4. FEVER: "Do you have a fever?" If so, ask: "What is your temperature, how was it measured, and when did it start?"     No 5. SPUTUM: "Describe the color of your sputum" (clear, white, yellow, green)     Clear 6. HEMOPTYSIS: "Are you coughing up any blood?" If so ask: "How much?" (flecks, streaks, tablespoons, etc.)     No 7. CARDIAC HISTORY: "Do you have any history of heart disease?" (e.g., heart attack, congestive heart failure)      No  8. LUNG HISTORY: "Do you have any history of lung disease?"  (e.g., pulmonary embolus, asthma, emphysema)     I'm on inhalers 9. PE RISK FACTORS: "Do you have a history of blood clots?" (or: recent major surgery, recent prolonged travel, bedridden )     No 10. OTHER SYMPTOMS: "Do you have any other symptoms?" (e.g., runny nose, wheezing, chest pain)       Runny nose 11. PREGNANCY: "Is there any chance you are pregnant?" "When was your last menstrual period?"       No 12. TRAVEL: "Have you traveled out of the country in the last month?" (e.g., travel history, exposures)       No  Protocols used: COUGH - ACUTE PRODUCTIVE-A-AH

## 2017-04-19 NOTE — Telephone Encounter (Signed)
FYI from triage.

## 2017-04-19 NOTE — Telephone Encounter (Signed)
Patient called to report she began having SOB and dizziness along with vomiting. She wanted an appointment however, I urged her to call 911 immediately or stay on the line while I called emergency services for her.  She stated she would call 911 upon hanging up. I attempted to ask further questions but she began saying I'm okay and just want to see a doctor now. I reitterrated that she needed emergency care immediately.

## 2017-04-19 NOTE — Progress Notes (Signed)
BP 101/67 (BP Location: Left Arm, Patient Position: Sitting, Cuff Size: Normal)   Pulse 78   Temp 97.8 F (36.6 C) (Oral)   Wt 148 lb 11.2 oz (67.4 kg)   LMP  (LMP Unknown)   SpO2 100%   BMI 25.68 kg/m    Subjective:    Patient ID: Cindy Neal, female    DOB: [birth year], 62 y.o.   MRN: [id number]  HPI: Cindy Neal is a 62 y.o. female  Chief Complaint  Patient presents with  . Cough    States cough has been going on for week. Productive. When she coughs she will get dizzy patient states.  . Nasal Congestion  . Shortness of Breath  . Dizziness   Dizziness, SOB, fatigue, productive cough, nasal congestion that started this morning. Has been using her albuterol fairly regularly with some relief. Symbicort has been giving her thrush so she rarely uses it. Has significant allergies and compliant with regimen. Denies fevers, body aches, sweats, N/V.   Relevant past medical, surgical, family and social history reviewed and updated as indicated. Interim medical history since our last visit reviewed. Allergies and medications reviewed and updated.  Review of Systems  Constitutional: Positive for fatigue.  HENT: Positive for congestion.   Respiratory: Positive for cough and shortness of breath.   Cardiovascular: Negative.   Gastrointestinal: Negative.   Genitourinary: Negative.   Musculoskeletal: Negative.   Neurological: Positive for dizziness.  Psychiatric/Behavioral: Negative.     Per HPI unless specifically indicated above     Objective:    BP 101/67 (BP Location: Left Arm, Patient Position: Sitting, Cuff Size: Normal)   Pulse 78   Temp 97.8 F (36.6 C) (Oral)   Wt 148 lb 11.2 oz (67.4 kg)   LMP  (LMP Unknown)   SpO2 100%   BMI 25.68 kg/m   Wt Readings from Last 3 Encounters:  04/19/17 148 lb 11.2 oz (67.4 kg)  01/19/17 161 lb 9.6 oz (73.3 kg)  12/28/16 168 lb (76.2 kg)    Physical Exam  Constitutional: She is oriented to person, place, and time. She  appears well-developed and well-nourished.  HENT:  Head: Atraumatic.  Eyes: Conjunctivae are normal. Pupils are equal, round, and reactive to light.  Neck: Normal range of motion. Neck supple.  Cardiovascular: Normal rate, regular rhythm and normal heart sounds.  Pulmonary/Chest: Effort normal and breath sounds normal. No respiratory distress.  Musculoskeletal: Normal range of motion.  Neurological: She is alert and oriented to person, place, and time.  Skin: Skin is warm and dry.  Psychiatric: She has a normal mood and affect. Her behavior is normal.  Nursing note and vitals reviewed.   Results for orders placed or performed in visit on 01/19/17  Comprehensive metabolic panel  Result Value Ref Range   Glucose 103 (H) 65 - 99 mg/dL   BUN 10 8 - 27 mg/dL   Creatinine, Ser 4.09 0.57 - 1.00 mg/dL   GFR calc non Af Amer 95 >59 mL/min/1.73   GFR calc Af Amer 109 >59 mL/min/1.73   BUN/Creatinine Ratio 15 12 - 28   Sodium 143 134 - 144 mmol/L   Potassium 3.8 3.5 - 5.2 mmol/L   Chloride 107 (H) 96 - 106 mmol/L   CO2 21 20 - 29 mmol/L   Calcium 9.5 8.7 - 10.3 mg/dL   Total Protein 6.4 6.0 - 8.5 g/dL   Albumin 4.2 3.6 - 4.8 g/dL   Globulin, Total 2.2 1.5 - 4.5 g/dL  Albumin/Globulin Ratio 1.9 1.2 - 2.2   Bilirubin Total 0.3 0.0 - 1.2 mg/dL   Alkaline Phosphatase 81 39 - 117 IU/L   AST 20 0 - 40 IU/L   ALT 27 0 - 32 IU/L  CBC with Differential/Platelet  Result Value Ref Range   WBC 10.3 3.4 - 10.8 x10E3/uL   RBC 4.71 3.77 - 5.28 x10E6/uL   Hemoglobin 13.9 11.1 - 15.9 g/dL   Hematocrit 16.142.7 09.634.0 - 46.6 %   MCV 91 79 - 97 fL   MCH 29.5 26.6 - 33.0 pg   MCHC 32.6 31.5 - 35.7 g/dL   RDW 04.513.5 40.912.3 - 81.115.4 %   Platelets 261 150 - 379 x10E3/uL   Neutrophils 72 Not Estab. %   Lymphs 18 Not Estab. %   Monocytes 8 Not Estab. %   Eos 2 Not Estab. %   Basos 0 Not Estab. %   Neutrophils Absolute 7.4 (H) 1.4 - 7.0 x10E3/uL   Lymphocytes Absolute 1.9 0.7 - 3.1 x10E3/uL   Monocytes  Absolute 0.8 0.1 - 0.9 x10E3/uL   EOS (ABSOLUTE) 0.2 0.0 - 0.4 x10E3/uL   Basophils Absolute 0.0 0.0 - 0.2 x10E3/uL   Immature Granulocytes 0 Not Estab. %   Immature Grans (Abs) 0.0 0.0 - 0.1 x10E3/uL  Lipid Panel w/o Chol/HDL Ratio  Result Value Ref Range   Cholesterol, Total 180 100 - 199 mg/dL   Triglycerides 914241 (H) 0 - 149 mg/dL   HDL 42 >78>39 mg/dL   VLDL Cholesterol Cal 48 (H) 5 - 40 mg/dL   LDL Calculated 90 0 - 99 mg/dL  TSH  Result Value Ref Range   TSH 5.030 (H) 0.450 - 4.500 uIU/mL      Assessment & Plan:   Problem List Items Addressed This Visit    None    Visit Diagnoses    Viral URI with cough    -  Primary   Will tx with prednisone, tussionex, and regular allergy and inhaler regimen. Get back on BID symbicort use for long term relief. Supportive care reviewed   Relevant Medications   nystatin (MYCOSTATIN) 100000 UNIT/ML suspension   Thrush, oral       Nystatin gargle sent, start probiotic to help prevent future infections and reviewed good rinsing after symbicort use.    Relevant Medications   nystatin (MYCOSTATIN) 100000 UNIT/ML suspension       Follow up plan: Return if symptoms worsen or fail to improve.

## 2017-04-20 ENCOUNTER — Encounter: Payer: Self-pay | Admitting: Psychiatry

## 2017-04-20 ENCOUNTER — Ambulatory Visit (INDEPENDENT_AMBULATORY_CARE_PROVIDER_SITE_OTHER): Payer: BLUE CROSS/BLUE SHIELD | Admitting: Psychiatry

## 2017-04-20 ENCOUNTER — Other Ambulatory Visit: Payer: Self-pay

## 2017-04-20 VITALS — BP 134/77 | HR 101 | Temp 98.4°F | Wt 146.6 lb

## 2017-04-20 DIAGNOSIS — F3341 Major depressive disorder, recurrent, in partial remission: Secondary | ICD-10-CM | POA: Diagnosis not present

## 2017-04-20 DIAGNOSIS — F411 Generalized anxiety disorder: Secondary | ICD-10-CM | POA: Diagnosis not present

## 2017-04-20 MED ORDER — MIRTAZAPINE 15 MG PO TABS: 15.0000 mg | ORAL_TABLET | Freq: Every day | ORAL | 1 refills | Status: DC

## 2017-04-20 MED ORDER — LORAZEPAM 0.5 MG PO TABS: 0.5000 mg | ORAL_TABLET | Freq: Every day | ORAL | 2 refills | Status: DC | PRN

## 2017-04-20 MED ORDER — ARIPIPRAZOLE 10 MG PO TABS: 10.0000 mg | ORAL_TABLET | Freq: Every day | ORAL | 1 refills | Status: DC

## 2017-04-20 NOTE — Progress Notes (Signed)
[facility] MD OP Progress Note  04/20/2017 3:15 PM Cindy Neal  MRN:  [id number]  Chief Complaint: '' I am ok."  Chief Complaint    Follow-up; Medication Refill     HPI: Cindy Neal is a 62 year old widowed female who lives in [place], is employed, has a history of depression and anxiety, presented to the clinic today for a follow-up visit.  Cindy Neal has a history of hearing impairment and has bilateral cochlear implant.  Cindy Neal's  appointment was scheduled at 2;30 PM today but she presented to the clinic at 10:30 AM today.  She apologized for her mistake.  Cindy Neal  used to follow up with Dr. Garnetta Buddy in the past.  This is her first visit with Clinical research associate.  She reports she is doing well on the current combination of medication.  She continues to have anxiety symptoms, feeling overwhelmed, stressed at work and so on.  She reports she needs the lorazepam 0.5 mg daily in the morning and that helps her with her symptoms.  Reports she will try to cut down if she cannot but she does not think she can possibly do the same.  Based on review of records from Dr. Garnetta Buddy she was tried on Vistaril in the past but it was discontinued.  Cindy Neal reports a history of depression with some mood lability.  She reports she is currently on Abilify which has been keeping her mood stable.  She also takes the Remeron which helps her with her depression and her anxiety symptoms.  Cindy Neal denies any suicidality or homicidality at this time.  She denies any perceptual disturbances.  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   Continues to have good support system from her siblings as well as friends.   Visit Diagnosis:    ICD-10-CM   1. GAD (generalized anxiety disorder) F41.1 ARIPiprazole (ABILIFY) 10 MG tablet    mirtazapine (REMERON) 15 MG tablet    LORazepam (ATIVAN) 0.5 MG tablet  2. Major depressive disorder, recurrent episode, in partial remission with mixed features (HCC) F33.41 ARIPiprazole  (ABILIFY) 10 MG tablet    Past Psychiatric History: Reports a history of inpatient mental health admission at least twice in the past.  She reports a history of suicide attempt in the past.  Per review of EHR she has been tried on medications like trazodone, Neurontin, Zoloft, lithium, hydroxyzine.  Past Medical History:  Past Medical History:  Diagnosis Date  . Anxiety   . Depression   . GERD (gastroesophageal reflux disease)   . Hyperlipidemia   . Osteoporosis     Past Surgical History:  Procedure Laterality Date  . CHOLECYSTECTOMY N/A 04/13/2015   Procedure: LAPAROSCOPIC CHOLECYSTECTOMY;  Surgeon: Gladis Riffle, MD;  Location: [facility] ORS;  Service: General;  Laterality: N/A;  . COCHLEAR IMPLANT Bilateral 10/15, 04/16  . JOINT REPLACEMENT Right    Hip  . MINOR EXCISION OF ORAL LESION N/A 08/30/2014   Procedure: MINOR EXCISION OF ORAL LESION;  Surgeon: Vernie Murders, MD;  Location: [facility];  Service: ENT;  Laterality: N/A;  HARD PALATE LESION  . WRIST SURGERY Right     Family Psychiatric History: Denied history of mental health problems in family.  Family History:  Family History  Problem Relation Age of Onset  . Cancer Mother        lung  . Hypertension Mother   . Hyperlipidemia Mother   . Lung disease Maternal Grandmother   . Emphysema Father   . Thyroid  disease Father    Substance abuse history: Denies   Social History: She is widowed.  She lives in [place].  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  She denies having children.  She graduated high school and did 2 years of college. Social History   Socioeconomic History  . Marital status: Married    Spouse name: None  . Number of children: None  . Years of education: None  . Highest education level: None  Social Needs  . Financial resource strain: None  . Food insecurity - worry: None  . Food insecurity - inability: None  . Transportation needs - medical: None  . Transportation  needs - non-medical: None  Occupational History  . None  Tobacco Use  . Smoking status: Former Smoker    Last attempt to quit: 04/13/2006    Years since quitting: 11.0  . Smokeless tobacco: Never Used  Substance and Sexual Activity  . Alcohol use: No  . Drug use: No  . Sexual activity: Yes  Other Topics Concern  . None  Social History Narrative  . None    Allergies: No Known Allergies  Metabolic Disorder Labs: No results found for: HGBA1C, MPG No results found for: PROLACTIN Lab Results  Component Value Date   CHOL 180 01/19/2017   TRIG 241 (H) 01/19/2017   HDL 42 01/19/2017   LDLCALC 90 01/19/2017   LDLCALC 123 (H) 01/15/2016   Lab Results  Component Value Date   TSH 5.030 (H) 01/19/2017   TSH 2.960 01/15/2016    Therapeutic Level Labs: No results found for: LITHIUM No results found for: VALPROATE No components found for:  CBMZ  Current Medications: Current Outpatient Medications  Medication Sig Dispense Refill  . albuterol (PROAIR HFA) 108 (90 Base) MCG/ACT inhaler Inhale 1 puff into the lungs every 6 (six) hours as needed for wheezing or shortness of breath. 1 Inhaler 6  . ARIPiprazole (ABILIFY) 10 MG tablet Take 1 tablet (10 mg total) by mouth daily. AM 90 tablet 1  . aspirin EC 81 MG tablet Take 81 mg by mouth daily.    . budesonide-formoterol (SYMBICORT) 160-4.5 MCG/ACT inhaler Inhale 2 puffs 2 (two) times daily into the lungs. 3 Inhaler 3  . cetirizine (ZYRTEC) 10 MG tablet Take 1 tablet (10 mg total) by mouth daily. 30 tablet 11  . chlorpheniramine-HYDROcodone (TUSSIONEX PENNKINETIC ER) 10-8 MG/5ML SUER Take 5 mLs by mouth every 12 (twelve) hours as needed. 50 mL 0  . cholecalciferol (VITAMIN D) 1000 units tablet Take 1,000 Units by mouth daily.    Marland Kitchen. ibuprofen (ADVIL,MOTRIN) 200 MG tablet Take 400 mg by mouth every 4 (four) hours as needed.    Marland Kitchen. levothyroxine (SYNTHROID, LEVOTHROID) 112 MCG tablet TAKE 1 TABLET (112 MCG TOTAL) BY MOUTH DAILY BEFORE  BREAKFAST. 90 tablet 1  . LORazepam (ATIVAN) 0.5 MG tablet Take 1 tablet (0.5 mg total) by mouth daily as needed for anxiety. 30 tablet 2  . mirtazapine (REMERON) 15 MG tablet Take 1 tablet (15 mg total) by mouth at bedtime. 90 tablet 1  . montelukast (SINGULAIR) 10 MG tablet Take 1 tablet (10 mg total) by mouth at bedtime. 90 tablet 3  . nystatin (MYCOSTATIN) 100000 UNIT/ML suspension Take 5 mLs (500,000 Units total) by mouth 4 (four) times daily. 473 mL 3  . omeprazole (PRILOSEC) 20 MG capsule Take 20 mg by mouth daily.     . predniSONE (DELTASONE) 10 MG tablet Take 6 tabs day one, 5 tabs day  two, 4 tabs day three, etc 21 tablet 0  . raloxifene (EVISTA) 60 MG tablet Take 1 tablet (60 mg total) by mouth daily. 90 tablet 4  . raloxifene (EVISTA) 60 MG tablet TAKE 1 TABLET (60 MG TOTAL) BY MOUTH DAILY. 90 tablet 3  . vitamin C (ASCORBIC ACID) 500 MG tablet Take 500 mg by mouth daily.     No current facility-administered medications for this visit.      Musculoskeletal: Strength & Muscle Tone: within normal limits Gait & Station: normal Patient leans: N/A  Psychiatric Specialty Exam: Review of Systems  Psychiatric/Behavioral: Positive for depression. The patient is nervous/anxious.   All other systems reviewed and are negative.   Blood pressure 134/77, pulse (!) 101, temperature 98.4 F (36.9 C), temperature source Oral, weight 146 lb 9.6 oz (66.5 kg).Body mass index is 25.32 kg/m.  General Appearance: Casual  Eye Contact:  Fair  Speech:  Normal Rate  Volume:  Normal  Mood:  Anxious  Affect:  Congruent  Thought Process:  Goal Directed and Descriptions of Associations: Intact  Orientation:  Full (Time, Place, and Person)  Thought Content: Logical   Suicidal Thoughts:  No  Homicidal Thoughts:  No  Memory:  Immediate;   Fair Recent;   Fair Remote;   Fair  Judgement:  Fair  Insight:  Fair  Psychomotor Activity:  Normal  Concentration:  Concentration: Fair and Attention Span:  Fair  Recall:  [employer] of Knowledge: Fair  Language: Fair  Akathisia:  No  Handed:  Right  AIMS (if indicated): 0  Assets:  Communication Skills Desire for Improvement Housing Social Support  ADL's:  Intact  Cognition: WNL  Sleep:  Fair   Screenings: GAD-7     Office Visit from 10/03/2015 in [facility] Office Visit from 08/05/2015 in [facility]  Total GAD-7 Score  15  19    PHQ2-9     Office Visit from 01/19/2017 in [facility] Office Visit from 01/15/2016 in [facility] Office Visit from 08/05/2015 in [facility]  PHQ-2 Total Score  0  0  2  PHQ-9 Total Score  2  3  13        Assessment and Plan: Milley is a 62 year old Caucasian female who has a history of depression, anxiety, history of hearing impairment with bilateral cochlear implant, presented to the clinic today for a follow-up visit.  Cindy Neal reports she is currently doing well on the current combination of medications.  She does have a history of past inpatient admission as well as suicide attempts.  Discussed plan as noted below.  She continues to be a good candidate for outpatient treatment.  Plan For depression Continue Remeron 15 mg nightly Abilify 10 mg p.o. daily  For anxiety Continue Remeron 15 mg p.o. nightly Continue lorazepam 0.5 mg, changed to daily as needed.  Discussed with her about the risk of being on benzodiazepine therapy for long-term.  Reviewed [place] controlled substance database.  She reports she needs to take the lorazepam every day but will  try to cut down.  AIMS equals 0  Reviewed medical records in EHR Per Dr. Garnetta Buddy.  Follow-up in 2-3 months or sooner if needed.  Spent 10 minutes of session providing psychoeducation/supportive psychotherapy.  More than 50 % of the time was spent for psychoeducation and supportive psychotherapy and care coordination.  This note was generated in part or whole with voice recognition software.  Voice recognition is usually quite accurate but  there are transcription errors that can and very often do occur. I apologize for any typographical errors that were not detected and corrected.      Jomarie Longs, MD 04/20/2017, 3:15 PM

## 2017-04-22 NOTE — Patient Instructions (Signed)
Follow up as needed

## 2017-04-23 ENCOUNTER — Telehealth: Payer: Self-pay | Admitting: Family Medicine

## 2017-04-23 ENCOUNTER — Encounter: Payer: Self-pay | Admitting: Family Medicine

## 2017-04-23 NOTE — Telephone Encounter (Signed)
OK to generate note and have her pick up  Copied from CRM [id number]. Topic: General - Other >> Apr 23, 2017 11:12 AM Arlyss Gandyichardson, Taren N, NT wrote: Reason for CRM: Patient needing a note to be out of work through 04/23/17 for a respiratory  virus. She is still sick and was unable to return to work today. She wants to pick this letter up at the office.   >> Apr 23, 2017 11:17 AM Adela PortsWilliamson, Christan M wrote: Just wondered if this was something that could be done.

## 2017-07-19 ENCOUNTER — Ambulatory Visit (INDEPENDENT_AMBULATORY_CARE_PROVIDER_SITE_OTHER): Payer: BLUE CROSS/BLUE SHIELD | Admitting: Psychiatry

## 2017-07-19 ENCOUNTER — Other Ambulatory Visit: Payer: Self-pay

## 2017-07-19 ENCOUNTER — Encounter: Payer: Self-pay | Admitting: Psychiatry

## 2017-07-19 VITALS — BP 129/78 | HR 81 | Temp 97.9°F | Wt 142.8 lb

## 2017-07-19 DIAGNOSIS — F3341 Major depressive disorder, recurrent, in partial remission: Secondary | ICD-10-CM | POA: Diagnosis not present

## 2017-07-19 DIAGNOSIS — F411 Generalized anxiety disorder: Secondary | ICD-10-CM | POA: Diagnosis not present

## 2017-07-19 MED ORDER — PROPRANOLOL HCL 10 MG PO TABS: 10.0000 mg | ORAL_TABLET | Freq: Two times a day (BID) | ORAL | 1 refills | Status: DC | PRN

## 2017-07-19 MED ORDER — LORAZEPAM 0.5 MG PO TABS
0.5000 mg | ORAL_TABLET | Freq: Every day | ORAL | 0 refills | Status: DC | PRN
Start: 2017-07-19 — End: 2017-08-18

## 2017-07-19 NOTE — Patient Instructions (Signed)
Please skip the lorazepam in the morning on Mondays and Fridays.  You can use the propranolol as needed for anxiety symptoms.  You are being weaned off Lorazepam.

## 2017-07-19 NOTE — Progress Notes (Signed)
BH MD OP Progress Note  07/19/2017 12:07 PM Cindy Neal  MRN:  [id number]  Chief Complaint: ' I am fine." Chief Complaint    Follow-up; Medication Refill     HPI: Cindy Neal is a 62 year old widowed female who lives in [place], employed, has a history of depression, anxiety,hx of hearing impairment with bilateral cochlear implant presented to the clinic today for a follow-up visit.  Cindy Neal today reports she is doing well on the current medications.  She reports she continues to feel she needs the lorazepam daily in the morning since it has been helpful with her anxiety throughout the day.  She otherwise denies any symptoms today.  She denies any depressive symptoms or mood lability.  She is compliant on the Abilify and the Remeron.  She denies any side effects to these medications.  She reports sleep is good.  She denies any suicidality or perceptual disturbances.  Discussed with patient about long-term use of benzodiazepines like lorazepam, risk of addiction, cognitive issues, delirium and so on.  Just with her about adding another medication as needed to help her anxiety symptoms during the day.  Also discussed her Abilify and Remeron can be increased as needed.  Provided her written instruction to wean lorazepam off slowly.  Patient will see me back in 4 weeks from now.   Visit Diagnosis:    ICD-10-CM   1. GAD (generalized anxiety disorder) F41.1 LORazepam (ATIVAN) 0.5 MG tablet    propranolol (INDERAL) 10 MG tablet  2. Major depressive disorder, recurrent episode, in partial remission with mixed features (HCC) F33.41     Past Psychiatric History: Reports a history of inpatient mental health admission at least twice in the past.  She reports a history of suicide attempt in the past.  Per review of [facility] R she has been tried on medications like trazodone, Neurontin, Zoloft, lithium, hydroxyzine.  Past Medical History:  Past Medical History:  Diagnosis Date  . Anxiety   . Depression    . GERD (gastroesophageal reflux disease)   . Hyperlipidemia   . Osteoporosis     Past Surgical History:  Procedure Laterality Date  . CHOLECYSTECTOMY N/A 04/13/2015   Procedure: LAPAROSCOPIC CHOLECYSTECTOMY;  Surgeon: Gladis Riffle, MD;  Location: [facility] ORS;  Service: General;  Laterality: N/A;  . COCHLEAR IMPLANT Bilateral 10/15, 04/16  . JOINT REPLACEMENT Right    Hip  . MINOR EXCISION OF ORAL LESION N/A 08/30/2014   Procedure: MINOR EXCISION OF ORAL LESION;  Surgeon: Vernie Murders, MD;  Location: [facility];  Service: ENT;  Laterality: N/A;  HARD PALATE LESION  . WRIST SURGERY Right     Family Psychiatric History: Denied history of mental health problems in family  Family History:  Family History  Problem Relation Age of Onset  . Cancer Mother        lung  . Hypertension Mother   . Hyperlipidemia Mother   . Lung disease Maternal Grandmother   . Emphysema Father   . Thyroid disease Father   Substance abuse history: Denies   Social History:She is widowed.  She lives in [place].  She continues to work at the [employer].  She denies having children.  She graduated high school and did 2 years of college. Social History   Socioeconomic History  . Marital status: Married    Spouse name: Not on file  . Number of children: Not on file  . Years of education: Not on file  . Highest education level: Not  on file  Occupational History  . Not on file  Social Needs  . Financial resource strain: Not on file  . Food insecurity:    Worry: Not on file    Inability: Not on file  . Transportation needs:    Medical: Not on file    Non-medical: Not on file  Tobacco Use  . Smoking status: Former Smoker    Last attempt to quit: 04/13/2006    Years since quitting: 11.2  . Smokeless tobacco: Never Used  Substance and Sexual Activity  . Alcohol use: No  . Drug use: No  . Sexual activity: Yes  Lifestyle  . Physical activity:    Days per week: Not on file     Minutes per session: Not on file  . Stress: Not on file  Relationships  . Social connections:    Talks on phone: Not on file    Gets together: Not on file    Attends religious service: Not on file    Active member of club or organization: Not on file    Attends meetings of clubs or organizations: Not on file    Relationship status: Not on file  Other Topics Concern  . Not on file  Social History Narrative  . Not on file    Allergies: No Known Allergies  Metabolic Disorder Labs: No results found for: HGBA1C, MPG No results found for: PROLACTIN Lab Results  Component Value Date   CHOL 180 01/19/2017   TRIG 241 (H) 01/19/2017   HDL 42 01/19/2017   LDLCALC 90 01/19/2017   LDLCALC 123 (H) 01/15/2016   Lab Results  Component Value Date   TSH 5.030 (H) 01/19/2017   TSH 2.960 01/15/2016    Therapeutic Level Labs: No results found for: LITHIUM No results found for: VALPROATE No components found for:  CBMZ  Current Medications: Current Outpatient Medications  Medication Sig Dispense Refill  . albuterol (PROAIR HFA) 108 (90 Base) MCG/ACT inhaler Inhale 1 puff into the lungs every 6 (six) hours as needed for wheezing or shortness of breath. 1 Inhaler 6  . ARIPiprazole (ABILIFY) 10 MG tablet Take 1 tablet (10 mg total) by mouth daily. AM 90 tablet 1  . aspirin EC 81 MG tablet Take 81 mg by mouth daily.    . budesonide-formoterol (SYMBICORT) 160-4.5 MCG/ACT inhaler Inhale 2 puffs 2 (two) times daily into the lungs. 3 Inhaler 3  . cetirizine (ZYRTEC) 10 MG tablet Take 1 tablet (10 mg total) by mouth daily. 30 tablet 11  . cholecalciferol (VITAMIN D) 1000 units tablet Take 1,000 Units by mouth daily.    Marland Kitchen. ibuprofen (ADVIL,MOTRIN) 200 MG tablet Take 400 mg by mouth every 4 (four) hours as needed.    Marland Kitchen. levothyroxine (SYNTHROID, LEVOTHROID) 112 MCG tablet TAKE 1 TABLET (112 MCG TOTAL) BY MOUTH DAILY BEFORE BREAKFAST. 90 tablet 1  . mirtazapine (REMERON) 15 MG tablet Take 1 tablet  (15 mg total) by mouth at bedtime. 90 tablet 1  . montelukast (SINGULAIR) 10 MG tablet Take 1 tablet (10 mg total) by mouth at bedtime. 90 tablet 3  . nystatin (MYCOSTATIN) 100000 UNIT/ML suspension Take 5 mLs (500,000 Units total) by mouth 4 (four) times daily. 473 mL 3  . omeprazole (PRILOSEC) 20 MG capsule Take 20 mg by mouth daily.     . raloxifene (EVISTA) 60 MG tablet Take 1 tablet (60 mg total) by mouth daily. 90 tablet 4  . vitamin C (ASCORBIC ACID) 500 MG tablet Take 500 mg  by mouth daily.    Marland Kitchen LORazepam (ATIVAN) 0.5 MG tablet Take 1 tablet (0.5 mg total) by mouth daily as needed for anxiety. 24 tablet 0  . propranolol (INDERAL) 10 MG tablet Take 1 tablet (10 mg total) by mouth 2 (two) times daily as needed. Only for severe anxiety sx 60 tablet 1   No current facility-administered medications for this visit.      Musculoskeletal: Strength & Muscle Tone: within normal limits Gait & Station: normal Patient leans: N/A  Psychiatric Specialty Exam: Review of Systems  Psychiatric/Behavioral: The patient is nervous/anxious.   All other systems reviewed and are negative.   Blood pressure 129/78, pulse 81, temperature 97.9 F (36.6 C), temperature source Oral, weight 142 lb 12.8 oz (64.8 kg).Body mass index is 24.67 kg/m.  General Appearance: Casual  Eye Contact:  Fair  Speech:  Clear and Coherent  Volume:  Normal  Mood:  Anxious  Affect:  Appropriate  Thought Process:  Goal Directed and Descriptions of Associations: Intact  Orientation:  Full (Time, Place, and Person)  Thought Content: Logical   Suicidal Thoughts:  No  Homicidal Thoughts:  No  Memory:  Immediate;   Fair Recent;   Fair Remote;   Fair  Judgement:  Fair  Insight:  Fair  Psychomotor Activity:  Normal  Concentration:  Concentration: Fair and Attention Span: Fair  Recall:  [employer] of Knowledge: Fair  Language: Fair  Akathisia:  No  Handed:  Right  AIMS (if indicated): Denies stiffness, rigidity.   Assets:  Communication Skills Desire for Improvement Housing Social Support  ADL's:  Intact  Cognition: WNL  Sleep:  Fair   Screenings: GAD-7     Office Visit from 10/03/2015 in [facility] Office Visit from 08/05/2015 in [facility]  Total GAD-7 Score  15  19    PHQ2-9     Office Visit from 01/19/2017 in [facility] Office Visit from 01/15/2016 in [facility] Office Visit from 08/05/2015 in [facility]  PHQ-2 Total Score  0  0  2  PHQ-9 Total Score  2  3  13        Assessment and Plan: Chip Boer is a 62 year old Caucasian female who has a history of depression, anxiety, history of hearing impairment with bilateral cochlear  implant, presented to the clinic today for a follow-up visit.  Clide Cliff reports she is currently doing well on the medications except for anxiety symptoms during the day.  She seems to believe she needs to take the lorazepam daily and is reluctant to come off of it.  Provided her medication education and discussed weaning of the lorazepam gradually.  She is agreeable.  Plan as noted below.  Plan For depression Continue Remeron 15 mg p.o. nightly Abilify 10 mg p.o. daily  For anxiety Continue Remeron 15 mg p.o. nightly Add propranolol 10 mg p.o. twice daily as needed for severe anxiety symptoms Discussed coming off of lorazepam gradually, she will skip lorazepam dose on Mondays and Fridays in the morning.  Discussed the risk of being on benzodiazepine therapy for long-term including its effect on cognitive function as well as addiction potential.  Also discussed referral for CBT for anxiety symptom management.  Patient declines at this time. [place] controlled substance database.  Follow-up in clinic in 4 weeks or sooner if needed.  More than 50 % of the time was spent for psychoeducation and supportive psychotherapy and care coordination.  This note was generated in part or  whole with voice recognition  software. Voice recognition is usually quite accurate but there are transcription errors that can and very often do occur. I apologize for any typographical errors that were not detected and corrected.       Jomarie Longs, MD 07/19/2017, 12:07 PM

## 2017-07-22 IMAGING — DX DG CHEST 2V
2 series · 2 of 2 positions shown · non-contrast
Comparison: Limited view of the chest from a CT scout radiograph of
March 10, 2012

CLINICAL DATA: For 5 months of productive cough. Also chest
congestion, shortness of breath, and wheezing intermittently.
History of asthmatic bronchitis, discontinued smoking 8 years ago.

EXAM:
CHEST  2 VIEW

[chest pa]
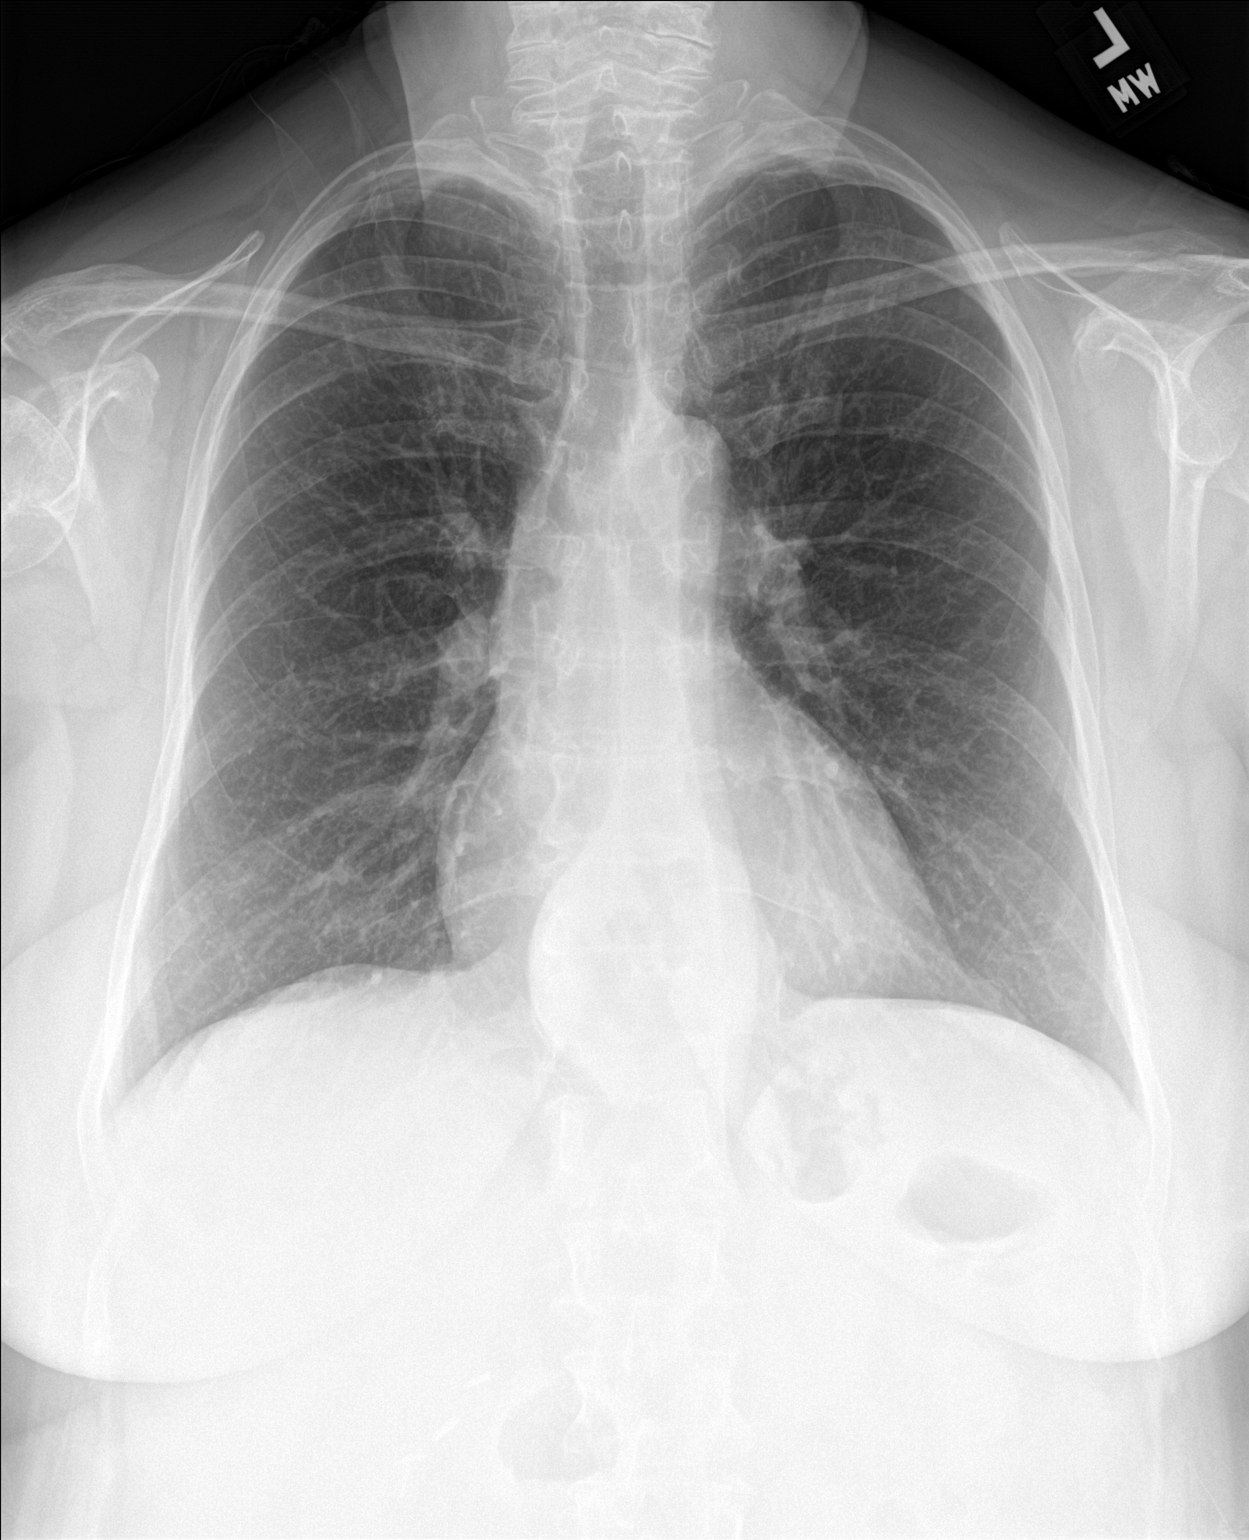

[chest lat]
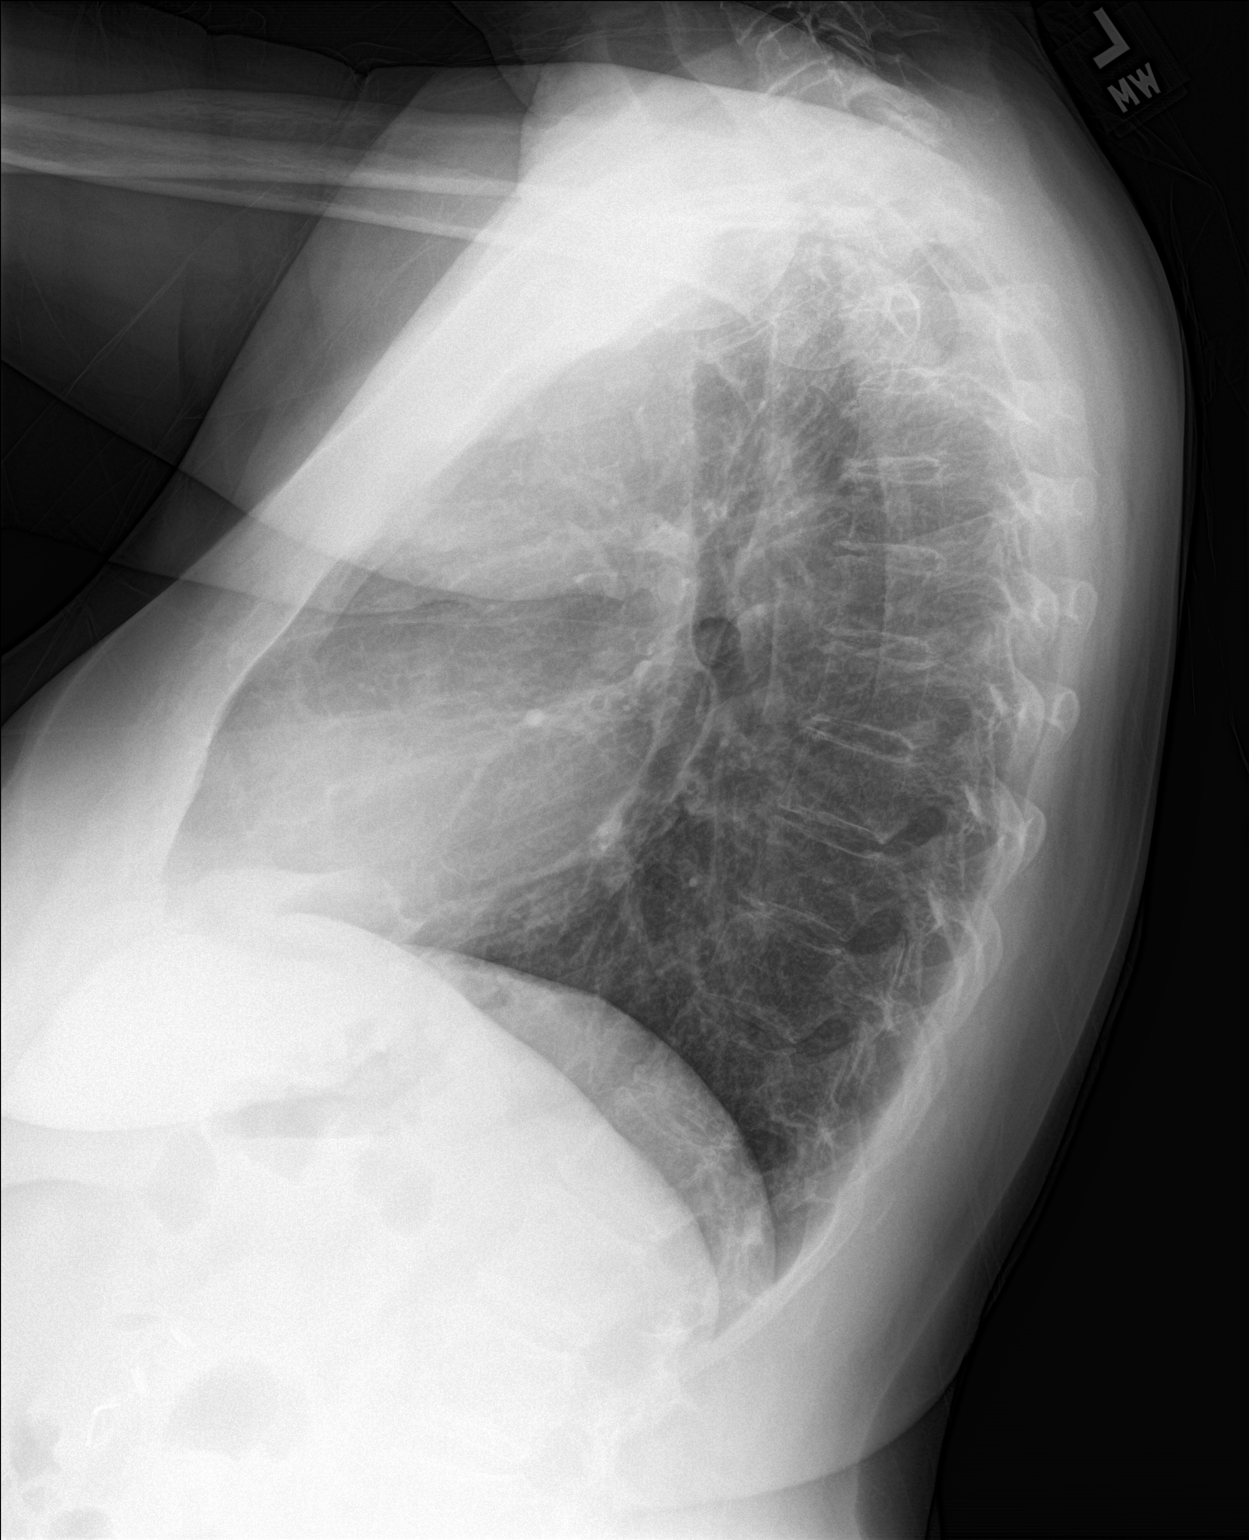

[2 of 2 positions shown; findings below may reference images not displayed]

FINDINGS: The lungs are mildly hyperinflated and clear. There is a
moderate-sized hiatal hernia. There is no pleural effusion. The
heart and pulmonary vascularity are normal. The mediastinum is
normal in width. The bony thorax exhibits no acute abnormality.
IMPRESSION: Chronic bronchitic changes. No pneumonia, CHF, nor other acute
cardiopulmonary abnormality. There is a moderate-sized hiatal
hernia.

## 2017-08-10 ENCOUNTER — Other Ambulatory Visit: Payer: Self-pay | Admitting: Psychiatry

## 2017-08-10 DIAGNOSIS — F411 Generalized anxiety disorder: Secondary | ICD-10-CM

## 2017-08-18 ENCOUNTER — Ambulatory Visit (INDEPENDENT_AMBULATORY_CARE_PROVIDER_SITE_OTHER): Payer: BLUE CROSS/BLUE SHIELD | Admitting: Psychiatry

## 2017-08-18 ENCOUNTER — Encounter: Payer: Self-pay | Admitting: Psychiatry

## 2017-08-18 ENCOUNTER — Other Ambulatory Visit: Payer: Self-pay

## 2017-08-18 VITALS — BP 114/78 | HR 106 | Temp 98.6°F | Wt 146.4 lb

## 2017-08-18 DIAGNOSIS — F3341 Major depressive disorder, recurrent, in partial remission: Secondary | ICD-10-CM

## 2017-08-18 DIAGNOSIS — F411 Generalized anxiety disorder: Secondary | ICD-10-CM | POA: Diagnosis not present

## 2017-08-18 DIAGNOSIS — G2581 Restless legs syndrome: Secondary | ICD-10-CM

## 2017-08-18 MED ORDER — LORAZEPAM 0.5 MG PO TABS: 0.2500 mg | ORAL_TABLET | Freq: Every day | ORAL | 0 refills | Status: DC

## 2017-08-18 MED ORDER — ROPINIROLE HCL 0.25 MG PO TABS: 0.2500 mg | ORAL_TABLET | Freq: Every day | ORAL | 1 refills | Status: DC

## 2017-08-18 NOTE — Progress Notes (Signed)
BH MD OP Progress Note  08/18/2017 2:17 PM Cindy Neal  MRN:  [id number]  Chief Complaint: ' I am here for follow up." Chief Complaint    Follow-up; Medication Refill     HPI: Cindy Neal is a 62 year old widowed female who lives in [place], employed, has a history of depression, anxiety, history of hearing impairment with bilateral cochlear implant, presented to the clinic today for a follow-up visit.  Pt today reports she continues to struggle with sleep.  She reports she has noticed some restless legs symptoms at night.  She reports her sleep is interrupted.  She has to wake up multiple times to use the restroom.  Discussed with her that she is already on a sleep medication called Remeron.  Discussed with her that we can add Requip.  She agrees with plan.  Reports she has been cutting down on the Ativan.  She has been taking Ativan 0.5 mg daily except for Mondays and Fridays.  Discussed with patient that we can continue to wean it off.  She reports she has noticed some restlessness and anxiety on the days that she does not take it.  She reports she however has been coping with it.  Patient denies any suicidality or perceptual disturbances.  Reports her work is going well.  Denies any other concerns today.  Visit Diagnosis:    ICD-10-CM   1. GAD (generalized anxiety disorder) F41.1 LORazepam (ATIVAN) 0.5 MG tablet  2. Major depressive disorder, recurrent episode, in partial remission with mixed features (HCC) F33.41   3. RLS (restless legs syndrome) G25.81     Past Psychiatric History: Have reviewed past psychiatric history from my progress note on 07/19/2017.  Past trials of trazodone, Neurontin, Zoloft, lithium, hydroxyzine.  Past Medical History:  Past Medical History:  Diagnosis Date  . Anxiety   . Depression   . GERD (gastroesophageal reflux disease)   . Hyperlipidemia   . Osteoporosis     Past Surgical History:  Procedure Laterality Date  . CHOLECYSTECTOMY N/A  04/13/2015   Procedure: LAPAROSCOPIC CHOLECYSTECTOMY;  Surgeon: Gladis Riffle, MD;  Location: [facility];  Service: General;  Laterality: N/A;  . COCHLEAR IMPLANT Bilateral 10/15, 04/16  . JOINT REPLACEMENT Right    Hip  . MINOR EXCISION OF ORAL LESION N/A 08/30/2014   Procedure: MINOR EXCISION OF ORAL LESION;  Surgeon: Vernie Murders, MD;  Location: [facility];  Service: ENT;  Laterality: N/A;  HARD PALATE LESION  . WRIST SURGERY Right     Family Psychiatric History: I have reviewed family psychiatric history from my progress note on 07/19/2017.  Family History:  Family History  Problem Relation Age of Onset  . Cancer Mother        lung  . Hypertension Mother   . Hyperlipidemia Mother   . Lung disease Maternal Grandmother   . Emphysema Father   . Thyroid disease Father    Substance abuse history: Denies  Social History: She is widowed.  She lives in [place].  She continues to work at [employer].  She denies having children. Social History   Socioeconomic History  . Marital status: Married    Spouse name: Not on file  . Number of children: Not on file  . Years of education: Not on file  . Highest education level: Not on file  Occupational History  . Not on file  Social Needs  . Financial resource strain: Not on file  . Food insecurity:    Worry: Not  on file    Inability: Not on file  . Transportation needs:    Medical: Not on file    Non-medical: Not on file  Tobacco Use  . Smoking status: Former Smoker    Last attempt to quit: 04/13/2006    Years since quitting: 11.3  . Smokeless tobacco: Never Used  Substance and Sexual Activity  . Alcohol use: No  . Drug use: No  . Sexual activity: Yes  Lifestyle  . Physical activity:    Days per week: Not on file    Minutes per session: Not on file  . Stress: Not on file  Relationships  . Social connections:    Talks on phone: Not on file    Gets together: Not on file    Attends religious service:  Not on file    Active member of club or organization: Not on file    Attends meetings of clubs or organizations: Not on file    Relationship status: Not on file  Other Topics Concern  . Not on file  Social History Narrative  . Not on file    Allergies: No Known Allergies  Metabolic Disorder Labs: No results found for: HGBA1C, MPG No results found for: PROLACTIN Lab Results  Component Value Date   CHOL 180 01/19/2017   TRIG 241 (H) 01/19/2017   HDL 42 01/19/2017   LDLCALC 90 01/19/2017   LDLCALC 123 (H) 01/15/2016   Lab Results  Component Value Date   TSH 5.030 (H) 01/19/2017   TSH 2.960 01/15/2016    Therapeutic Level Labs: No results found for: LITHIUM No results found for: VALPROATE No components found for:  CBMZ  Current Medications: Current Outpatient Medications  Medication Sig Dispense Refill  . albuterol (PROAIR HFA) 108 (90 Base) MCG/ACT inhaler Inhale 1 puff into the lungs every 6 (six) hours as needed for wheezing or shortness of breath. 1 Inhaler 6  . ARIPiprazole (ABILIFY) 10 MG tablet Take 1 tablet (10 mg total) by mouth daily. AM 90 tablet 1  . aspirin EC 81 MG tablet Take 81 mg by mouth daily.    . budesonide-formoterol (SYMBICORT) 160-4.5 MCG/ACT inhaler Inhale 2 puffs 2 (two) times daily into the lungs. 3 Inhaler 3  . cetirizine (ZYRTEC) 10 MG tablet Take 1 tablet (10 mg total) by mouth daily. 30 tablet 11  . cholecalciferol (VITAMIN D) 1000 units tablet Take 1,000 Units by mouth daily.    Marland Kitchen ibuprofen (ADVIL,MOTRIN) 200 MG tablet Take 400 mg by mouth every 4 (four) hours as needed.    Marland Kitchen levothyroxine (SYNTHROID, LEVOTHROID) 112 MCG tablet TAKE 1 TABLET (112 MCG TOTAL) BY MOUTH DAILY BEFORE BREAKFAST. 90 tablet 1  . LORazepam (ATIVAN) 0.5 MG tablet Take 0.5-1 tablets (0.25-0.5 mg total) by mouth daily. 21 tablet 0  . mirtazapine (REMERON) 15 MG tablet Take 1 tablet (15 mg total) by mouth at bedtime. 90 tablet 1  . montelukast (SINGULAIR) 10 MG tablet  Take 1 tablet (10 mg total) by mouth at bedtime. 90 tablet 3  . nystatin (MYCOSTATIN) 100000 UNIT/ML suspension Take 5 mLs (500,000 Units total) by mouth 4 (four) times daily. 473 mL 3  . omeprazole (PRILOSEC) 20 MG capsule Take 20 mg by mouth daily.     . propranolol (INDERAL) 10 MG tablet TAKE 1 TABLET (10 MG TOTAL) BY MOUTH 2 (TWO) TIMES DAILY AS NEEDED. ONLY FOR SEVERE ANXIETY SX 60 tablet 0  . raloxifene (EVISTA) 60 MG tablet Take 1 tablet (60 mg total)  by mouth daily. 90 tablet 4  . vitamin C (ASCORBIC ACID) 500 MG tablet Take 500 mg by mouth daily.    Marland Kitchen rOPINIRole (REQUIP) 0.25 MG tablet Take 1 tablet (0.25 mg total) by mouth at bedtime. 30 tablet 1   No current facility-administered medications for this visit.      Musculoskeletal: Strength & Muscle Tone: within normal limits Gait & Station: normal Patient leans: N/A  Psychiatric Specialty Exam: Review of Systems  Psychiatric/Behavioral: The patient is nervous/anxious.   All other systems reviewed and are negative.   Blood pressure 114/78, pulse (!) 106, temperature 98.6 F (37 C), temperature source Oral, weight 146 lb 6.4 oz (66.4 kg).Body mass index is 25.29 kg/m.  General Appearance: Casual  Eye Contact:  Fair  Speech:  Normal Rate  Volume:  Normal  Mood:  Anxious  Affect:  Congruent  Thought Process:  Goal Directed and Descriptions of Associations: Intact  Orientation:  Full (Time, Place, and Person)  Thought Content: Logical   Suicidal Thoughts:  No  Homicidal Thoughts:  No  Memory:  Immediate;   Fair Recent;   Fair Remote;   Fair  Judgement:  Fair  Insight:  Fair  Psychomotor Activity:  Normal  Concentration:  Concentration: Fair and Attention Span: Fair  Recall:  [employer] of Knowledge: Fair  Language: Fair  Akathisia:  No  Handed:  Right  AIMS (if indicated):0  Assets:  Communication Skills Desire for Improvement Housing Social Support  ADL's:  Intact  Cognition: WNL  Sleep:  Poor    Screenings: GAD-7     Office Visit from 10/03/2015 in [facility] Office Visit from 08/05/2015 in [facility]  Total GAD-7 Score  15  19    PHQ2-9     Office Visit from 01/19/2017 in [facility] Office Visit from 01/15/2016 in [facility] Office Visit from 08/05/2015 in [facility]  PHQ-2 Total Score  0  0  2  PHQ-9 Total Score  Assessment and Plan: Fairy is a 62 year old Caucasian female who has a history of depression, anxiety, history of hearing impairment with bilateral cochlear implant, presented to the clinic today for a follow-up visit.  Patient today reports she has some sleep problems.  She struggles with restless leg symptoms.  She also has some anxiety on the days that she is not taking lorazepam but she agrees to continue to wean it off.  Discussed plan as noted below.  Plan For depression Continue Remeron 15 mg p.o. nightly Abilify 10 mg p.o. Daily.  Anxiety Continue Remeron 15 mg p.o. nightly Added propranolol 10 mg p.o. twice daily as needed for severe anxiety symptoms last visit.  Patient reports she has not yet taken it.  Discussed with her to try it on the days that she feels extremely anxious.Will continue to taper off lorazepam.  Discussed with patient to start taking lorazepam 0.25 mg on Sundays and rest of the days she will continue what she is on right now. Reviewed [place] controlled substance database.  For RLS Requip 0.25 mg po qhs .  Follow up in clinic in 1 month or sooner if needed.  More than 50 % of the time was spent for psychoeducation and supportive psychotherapy and care coordination.  This note was generated in part or whole with voice recognition software. Voice recognition is usually quite accurate but there are transcription errors that can and very  often do occur. I apologize for any typographical errors that were not detected and corrected.       Jomarie Longs,  MD 08/19/2017, 9:40 AM

## 2017-08-18 NOTE — Patient Instructions (Signed)
Please start taking ativan 0.25 mg ( half tab) on sundays . Please continue skipping dose on mondays and fridays and rest of the days you will take 0.5 mg as you were doing previously.  Ropinirole tablets What is this medicine? ROPINIROLE (roe PIN i role) is used to treat the symptoms of Parkinson's disease. It helps to improve muscle control and movement difficulties. It is also used for the treatment of Restless Legs Syndrome. This medicine may be used for other purposes; ask your health care provider or pharmacist if you have questions. COMMON BRAND NAME(S): Requip What should I tell my health care provider before I take this medicine? They need to know if you have any of these conditions: -dizzy or fainting spells -heart disease -high blood pressure -kidney disease -liver disease -low blood pressure -sleeping problems -an unusual or allergic reaction to ropinirole, other medicines, foods, dyes, or preservatives -pregnant or trying to get pregnant -breast-feeding How should I use this medicine? Take this medicine by mouth with a glass of water. Follow the directions on the prescription label. You can take it with or without food. If it upsets your stomach, take it with food. Take your doses at regular intervals. Do not take your medicine more often than directed. Do not stop taking this medicine except on your doctor's advice. Stopping this medicine too quickly may cause serious side effects. Talk to your pediatrician regarding the use of this medicine in children. Special care may be needed. Overdosage: If you think you have taken too much of this medicine contact a poison control center or emergency room at once. NOTE: This medicine is only for you. Do not share this medicine with others. What if I miss a dose? If you miss a dose, take it as soon as you can. If it is almost time for your next dose, take only that dose. Do not take double or extra doses. What may interact with this  medicine? -ciprofloxacin -female hormones, like estrogens and birth control pills -medicines for depression, anxiety, or psychotic disturbances -metoclopramide -mexiletine -norfloxacin -omeprazole This list may not describe all possible interactions. Give your health care provider a list of all the medicines, herbs, non-prescription drugs, or dietary supplements you use. Also tell them if you smoke, drink alcohol, or use illegal drugs. Some items may interact with your medicine. What should I watch for while using this medicine? Visit your doctor or health care professional for regular checks on your progress. It may be several weeks or months before you feel the full effect of this medicine. You may get drowsy or dizzy. Do not drive, use machinery, or do anything that needs mental alertness until you know how this drug affects you. Do not stand or sit up quickly, especially if you are an older patient. This reduces the risk of dizzy or fainting spells. Alcohol can increase possible dizziness. Avoid alcoholic drinks. If you find that you have sudden feelings of wanting to sleep during normal activities, like cooking, watching television, or while driving or riding in a car, you should contact your health care professional. Your mouth may get dry. Chewing sugarless gum or sucking hard candy, and drinking plenty of water may help. Contact your doctor if the problem does not go away or is severe. There have been reports of increased sexual urges or other strong urges such as gambling while taking some medicines for Parkinson's disease. If you experience any of these urges while taking this medicine, you should report it to  your health care provider as soon as possible. You should check your skin often for changes to moles and new growths while taking this medicine. Call your doctor if you notice any of these changes. What side effects may I notice from receiving this medicine? Side effects that you  should report to your doctor or health care professional as soon as possible: -allergic reactions like skin rash, itching or hives, swelling of the face, lips, or tongue -changes in vision -chest pain -confusion -falling asleep during normal activities like driving -fast, irregular heartbeat -feeling faint or lightheaded, falls -hallucination, loss of contact with reality -joint or muscle pain -loss of bladder control -loss of memory -new or increased gambling urges, sexual urges, uncontrolled spending, binge or compulsive eating, or other urges -pain, tingling, numbness in the hands or feet -shortness of breath, troubled breathing, tightness in chest, or wheezing -signs and symptoms of low blood pressure like dizziness; feeling faint or lightheaded, falls; unusually weak or tired -swelling of the ankles, feet, hands -uncontrollable head, mouth, neck, arm, or leg movements -vomiting Side effects that usually do not require medical attention (report to your doctor or health care professional if they continue or are bothersome): -dizziness -drowsiness -headache -increased sweating -nausea -tremors This list may not describe all possible side effects. Call your doctor for medical advice about side effects. You may report side effects to FDA at 1-800-FDA-1088. Where should I keep my medicine? Keep out of the reach of children. Store at room temperature between 20 and 25 degrees C (68 and 77 degrees F). Protect from light and moisture. Keep container tightly closed. Throw away any unused medicine after the expiration date. NOTE: This sheet is a summary. It may not cover all possible information. If you have questions about this medicine, talk to your doctor, pharmacist, or health care provider.  2018 Elsevier/Gold Standard (2015-09-16 10:58:05)

## 2017-08-19 ENCOUNTER — Encounter: Payer: Self-pay | Admitting: Psychiatry

## 2017-08-20 ENCOUNTER — Other Ambulatory Visit: Payer: Self-pay | Admitting: Psychiatry

## 2017-08-20 DIAGNOSIS — F411 Generalized anxiety disorder: Secondary | ICD-10-CM

## 2017-08-23 ENCOUNTER — Other Ambulatory Visit: Payer: Self-pay

## 2017-08-23 MED ORDER — BUDESONIDE-FORMOTEROL FUMARATE 160-4.5 MCG/ACT IN AERO: 2.0000 | INHALATION_SPRAY | Freq: Two times a day (BID) | RESPIRATORY_TRACT | 3 refills | Status: AC

## 2017-08-23 NOTE — Telephone Encounter (Signed)
Fax from pharmacy for refill for Symbicort.   Also states "Pt requests 90 day supply per insurance" [facility]

## 2017-09-01 ENCOUNTER — Ambulatory Visit: Payer: Self-pay | Admitting: *Deleted

## 2017-09-01 NOTE — Telephone Encounter (Signed)
Patient is calling to report she has chest congestion and cough that is causing her breathing problems. Her inhalers are not helping. She thinks she may need an antibiotic for bronchitis treatment. Call to office- they are completely booked- UC advised and patient will comply.  Reason for Disposition . Wheezing is present  Answer Assessment - Initial Assessment Questions 1. ONSET: "When did the cough begin?"      Last week while patient was at the beach 2. SEVERITY: "How bad is the cough today?"      Patient is coughing with talking- productive- she is wheezing 3. RESPIRATORY DISTRESS: "Describe your breathing."      She is having a hard time breathing because she is coughing so much 4. FEVER: "Do you have a fever?" If so, ask: "What is your temperature, how was it measured, and when did it start?"     no 5. SPUTUM: "Describe the color of your sputum" (clear, white, yellow, green)     Clear sputum 6. HEMOPTYSIS: "Are you coughing up any blood?" If so ask: "How much?" (flecks, streaks, tablespoons, etc.)     no 7. CARDIAC HISTORY: "Do you have any history of heart disease?" (e.g., heart attack, congestive heart failure)      no 8. LUNG HISTORY: "Do you have any history of lung disease?"  (e.g., pulmonary embolus, asthma, emphysema)     asthmatic bronchitis history  9. PE RISK FACTORS: "Do you have a history of blood clots?" (or: recent major surgery, recent prolonged travel, bedridden )     no 10. OTHER SYMPTOMS: "Do you have any other symptoms?" (e.g., runny nose, wheezing, chest pain)       Wheezing, congestion in nasal passages when has coughing spell 11. PREGNANCY: "Is there any chance you are pregnant?" "When was your last menstrual period?"       n/a 12. TRAVEL: "Have you traveled out of the country in the last month?" (e.g., travel history, exposures)       n/a  Protocols used: COUGH - ACUTE PRODUCTIVE-A-AH

## 2017-09-10 ENCOUNTER — Other Ambulatory Visit: Payer: Self-pay | Admitting: Psychiatry

## 2017-09-11 ENCOUNTER — Other Ambulatory Visit: Payer: Self-pay | Admitting: Psychiatry

## 2017-09-15 ENCOUNTER — Ambulatory Visit (INDEPENDENT_AMBULATORY_CARE_PROVIDER_SITE_OTHER): Payer: BLUE CROSS/BLUE SHIELD | Admitting: Psychiatry

## 2017-09-15 ENCOUNTER — Encounter: Payer: Self-pay | Admitting: Psychiatry

## 2017-09-15 ENCOUNTER — Other Ambulatory Visit: Payer: Self-pay

## 2017-09-15 VITALS — BP 121/76 | HR 76 | Temp 97.7°F | Wt 147.2 lb

## 2017-09-15 DIAGNOSIS — G2581 Restless legs syndrome: Secondary | ICD-10-CM

## 2017-09-15 DIAGNOSIS — F411 Generalized anxiety disorder: Secondary | ICD-10-CM

## 2017-09-15 DIAGNOSIS — F3341 Major depressive disorder, recurrent, in partial remission: Secondary | ICD-10-CM

## 2017-09-15 MED ORDER — LORAZEPAM 0.5 MG PO TABS: 0.2500 mg | ORAL_TABLET | Freq: Every day | ORAL | 2 refills | Status: AC

## 2017-09-15 MED ORDER — MIRTAZAPINE 15 MG PO TABS: 15.0000 mg | ORAL_TABLET | Freq: Every day | ORAL | 1 refills | Status: AC

## 2017-09-15 MED ORDER — ROPINIROLE HCL 0.25 MG PO TABS: 0.2500 mg | ORAL_TABLET | Freq: Every day | ORAL | 1 refills | Status: AC

## 2017-09-15 MED ORDER — ARIPIPRAZOLE 10 MG PO TABS: 10.0000 mg | ORAL_TABLET | Freq: Every day | ORAL | 1 refills | Status: AC

## 2017-09-15 NOTE — Patient Instructions (Signed)
Lorazepam please stop taking on mondays, fridays .  Lorazepam 0.25 mg ( half tab) on wednesdays and sundays.

## 2017-09-15 NOTE — Progress Notes (Signed)
BH MD OP Progress Note  09/15/2017 12:46 PM Cindy Neal  MRN:  [id number]  Chief Complaint:' I am here for follow up."  Chief Complaint    Follow-up; Medication Refill     HPI: Cindy Neal is a 62 yr old widowed female who lives in [place], employed, has a history of depression, anxiety, history of hearing impairment with bilateral cochlear implant, presented to the clinic today for a follow-up visit.  Pt today reports she had a fall and fractured her right forearm.  Patient reports this happened on vacation and she tripped on something and fell in her bathroom. Patient reports she has to wear a cast for at least 6 weeks.  Patient reports she is coping with it well.  She continues to work even though she is having trouble doing all her task at work.  Patient reports she is compliant on her medications.  She reports she is doing well on the Requip which is for her RLS.  She reports that has been really helpful and helps with her sleep.  Patient continues to wean herself off of the Ativan.  She is currently skipping Ativan Mondays and Fridays and taking half a dose on Sundays.  She denies any increased agitation or anxiety symptoms and reports propranolol is helpful when she needs it.  She denies any side effects to the propranolol.  Patient denies any suicidality.  Patient denies any perceptual disturbances.   Visit Diagnosis:    ICD-10-CM   1. GAD (generalized anxiety disorder) F41.1 ARIPiprazole (ABILIFY) 10 MG tablet    mirtazapine (REMERON) 15 MG tablet    LORazepam (ATIVAN) 0.5 MG tablet  2. Major depressive disorder, recurrent episode, in partial remission with mixed features (HCC) F33.41 ARIPiprazole (ABILIFY) 10 MG tablet  3. RLS (restless legs syndrome) G25.81     Past Psychiatric History: Reviewed past psychiatric history from my progress note on 07/19/2017.  Past trials of trazodone, Neurontin, Zoloft, lithium, hydroxyzine.  Past Medical History:  Past Medical History:   Diagnosis Date  . Anxiety   . Depression   . GERD (gastroesophageal reflux disease)   . Hyperlipidemia   . Osteoporosis     Past Surgical History:  Procedure Laterality Date  . CHOLECYSTECTOMY N/A 04/13/2015   Procedure: LAPAROSCOPIC CHOLECYSTECTOMY;  Surgeon: Gladis Riffle, MD;  Location: [facility];  Service: General;  Laterality: N/A;  . COCHLEAR IMPLANT Bilateral 10/15, 04/16  . JOINT REPLACEMENT Right    Hip  . MINOR EXCISION OF ORAL LESION N/A 08/30/2014   Procedure: MINOR EXCISION OF ORAL LESION;  Surgeon: Vernie Murders, MD;  Location: [facility];  Service: ENT;  Laterality: N/A;  HARD PALATE LESION  . WRIST SURGERY Right     Family Psychiatric History: I have reviewed family psychiatric history from my progress note on 07/19/2017.  Family History:  Family History  Problem Relation Age of Onset  . Cancer Mother        lung  . Hypertension Mother   . Hyperlipidemia Mother   . Lung disease Maternal Grandmother   . Emphysema Father   . Thyroid disease Father    Substance abuse history: Denies  Social History: Patient is widowed.  She lives in [place].  She continues to work at [employer].  She denies having children. Social History   Socioeconomic History  . Marital status: Married    Spouse name: Not on file  . Number of children: Not on file  . Years of education: Not on  file  . Highest education level: Not on file  Occupational History  . Not on file  Social Needs  . Financial resource strain: Not on file  . Food insecurity:    Worry: Not on file    Inability: Not on file  . Transportation needs:    Medical: Not on file    Non-medical: Not on file  Tobacco Use  . Smoking status: Former Smoker    Last attempt to quit: 04/13/2006    Years since quitting: 11.4  . Smokeless tobacco: Never Used  Substance and Sexual Activity  . Alcohol use: No  . Drug use: No  . Sexual activity: Yes  Lifestyle  . Physical activity:    Days per  week: Not on file    Minutes per session: Not on file  . Stress: Not on file  Relationships  . Social connections:    Talks on phone: Not on file    Gets together: Not on file    Attends religious service: Not on file    Active member of club or organization: Not on file    Attends meetings of clubs or organizations: Not on file    Relationship status: Not on file  Other Topics Concern  . Not on file  Social History Narrative  . Not on file    Allergies: No Known Allergies  Metabolic Disorder Labs: No results found for: HGBA1C, MPG No results found for: PROLACTIN Lab Results  Component Value Date   CHOL 180 01/19/2017   TRIG 241 (H) 01/19/2017   HDL 42 01/19/2017   LDLCALC 90 01/19/2017   LDLCALC 123 (H) 01/15/2016   Lab Results  Component Value Date   TSH 5.030 (H) 01/19/2017   TSH 2.960 01/15/2016    Therapeutic Level Labs: No results found for: LITHIUM No results found for: VALPROATE No components found for:  CBMZ  Current Medications: Current Outpatient Medications  Medication Sig Dispense Refill  . albuterol (PROAIR HFA) 108 (90 Base) MCG/ACT inhaler Inhale 1 puff into the lungs every 6 (six) hours as needed for wheezing or shortness of breath. 1 Inhaler 6  . ARIPiprazole (ABILIFY) 10 MG tablet Take 1 tablet (10 mg total) by mouth daily. AM 90 tablet 1  . aspirin EC 81 MG tablet Take 81 mg by mouth daily.    . budesonide-formoterol (SYMBICORT) 160-4.5 MCG/ACT inhaler Inhale 2 puffs into the lungs 2 (two) times daily. 3 Inhaler 3  . cetirizine (ZYRTEC) 10 MG tablet Take 1 tablet (10 mg total) by mouth daily. 30 tablet 11  . cholecalciferol (VITAMIN D) 1000 units tablet Take 1,000 Units by mouth daily.    Marland Kitchen. doxycycline (VIBRAMYCIN) 100 MG capsule Take 100 mg by mouth 2 (two) times daily.  0  . guaiFENesin-codeine 100-10 MG/5ML syrup Take by mouth.    Marland Kitchen. ibuprofen (ADVIL,MOTRIN) 200 MG tablet Take 400 mg by mouth every 4 (four) hours as needed.    Marland Kitchen. levothyroxine  (SYNTHROID, LEVOTHROID) 112 MCG tablet TAKE 1 TABLET (112 MCG TOTAL) BY MOUTH DAILY BEFORE BREAKFAST. 90 tablet 1  . LORazepam (ATIVAN) 0.5 MG tablet Take 0.5-1 tablets (0.25-0.5 mg total) by mouth daily. 18 tablet 2  . mirtazapine (REMERON) 15 MG tablet Take 1 tablet (15 mg total) by mouth at bedtime. 90 tablet 1  . montelukast (SINGULAIR) 10 MG tablet Take 1 tablet (10 mg total) by mouth at bedtime. 90 tablet 3  . nystatin (MYCOSTATIN) 100000 UNIT/ML suspension Take 5 mLs (500,000 Units total) by  mouth 4 (four) times daily. 473 mL 3  . omeprazole (PRILOSEC) 20 MG capsule Take 20 mg by mouth daily.     . propranolol (INDERAL) 10 MG tablet TAKE 1 TABLET (10 MG TOTAL) BY MOUTH 2 (TWO) TIMES DAILY AS NEEDED. ONLY FOR SEVERE ANXIETY SX 180 tablet 1  . raloxifene (EVISTA) 60 MG tablet Take 1 tablet (60 mg total) by mouth daily. 90 tablet 4  . rOPINIRole (REQUIP) 0.25 MG tablet Take 1 tablet (0.25 mg total) by mouth at bedtime. 90 tablet 1  . traMADol (ULTRAM) 50 MG tablet TAKE 1 TABLET BY MOUTH EVERY 4 HOURS AS NEEDED FOR PAIN    . vitamin C (ASCORBIC ACID) 500 MG tablet Take 500 mg by mouth daily.     No current facility-administered medications for this visit.      Musculoskeletal: Strength & Muscle Tone: within normal limits Gait & Station: normal Patient leans: N/A  Psychiatric Specialty Exam: Review of Systems  Psychiatric/Behavioral: The patient is nervous/anxious.   All other systems reviewed and are negative.   Blood pressure 121/76, pulse 76, temperature 97.7 F (36.5 C), temperature source Oral, weight 147 lb 3.2 oz (66.8 kg).Body mass index is 25.43 kg/m.  General Appearance: Casual  Eye Contact:  Fair  Speech:  Normal Rate  Volume:  Normal  Mood:  Euthymic  Affect:  Congruent  Thought Process:  Goal Directed and Descriptions of Associations: Intact  Orientation:  Full (Time, Place, and Person)  Thought Content: Logical   Suicidal Thoughts:  No  Homicidal Thoughts:  No   Memory:  Immediate;   Fair Recent;   Fair Remote;   Fair  Judgement:  Fair  Insight:  Fair  Psychomotor Activity:  Normal  Concentration:  Concentration: Fair and Attention Span: Fair  Recall:  [employer] of Knowledge: Fair  Language: Fair  Akathisia:  No  Handed:  Right  AIMS (if indicated): denies tremors, rigidty  Assets:  Communication Skills Desire for Improvement Social Support  ADL's:  Intact  Cognition: WNL  Sleep:  Fair   Screenings: GAD-7     Office Visit from 10/03/2015 in [facility] Office Visit from 08/05/2015 in [facility]  Total GAD-7 Score  15  19    PHQ2-9     Office Visit from 01/19/2017 in [facility] Office Visit from 01/15/2016 in [facility] Office Visit from 08/05/2015 in [facility]  PHQ-2 Total Score  0  0  2  PHQ-9 Total Score  2  3  13        Assessment and Plan: Rupal is a 62 year old Caucasian female who has a history of depression, anxiety, history of hearing impairment with bilateral cochlear implant, presented to the clinic today for a follow-up visit.  Patient today reports she is currently doing well on the current medication regimen.  She continues to wean herself off of the lorazepam and is doing well.  Discussed continuing the weaning process.  She agrees with plan.  Plan as noted below.  Plan For depression Continue Remeron 15 mg p.o. nightly Abilify 10 mg p.o. daily  For anxiety symptoms Continue Remeron 15 mg p.o. nightly Continue propranolol 10 mg p.o. twice daily as needed for severe anxiety symptoms We will continue to wean off lorazepam.  She will start taking lorazepam 0.25 mg on Wednesdays and Sundays and continue to skip dose on Mondays and Fridays.  Rest of the days she will take 0.5 mg.  RLS Requip  0.25 mg p.o. nightly  Follow-up in clinic in 2-3 months or sooner if needed.  More than 50 % of the time was spent for psychoeducation and supportive  psychotherapy and care coordination.  This note was generated in part or whole with voice recognition software. Voice recognition is usually quite accurate but there are transcription errors that can and very often do occur. I apologize for any typographical errors that were not detected and corrected.         Jomarie Longs, MD 09/15/2017, 12:46 PM

## 2017-09-22 ENCOUNTER — Other Ambulatory Visit: Payer: Self-pay | Admitting: Psychiatry

## 2017-09-22 DIAGNOSIS — F411 Generalized anxiety disorder: Secondary | ICD-10-CM

## 2017-12-16 ENCOUNTER — Ambulatory Visit: Payer: Self-pay | Admitting: Psychiatry

## 2017-12-21 ENCOUNTER — Other Ambulatory Visit: Payer: Self-pay | Admitting: Family Medicine

## 2017-12-26 ENCOUNTER — Other Ambulatory Visit: Payer: Self-pay | Admitting: Psychiatry

## 2017-12-26 DIAGNOSIS — F411 Generalized anxiety disorder: Secondary | ICD-10-CM

## 2017-12-27 ENCOUNTER — Telehealth: Payer: Self-pay | Admitting: Psychiatry

## 2017-12-27 DIAGNOSIS — F411 Generalized anxiety disorder: Secondary | ICD-10-CM

## 2017-12-27 MED ORDER — PROPRANOLOL HCL 10 MG PO TABS
10.0000 mg | ORAL_TABLET | Freq: Two times a day (BID) | ORAL | 1 refills | Status: AC | PRN
Start: 2017-12-27 — End: ?

## 2017-12-27 NOTE — Telephone Encounter (Signed)
Sent propranolol to pharmacy 

## 2018-01-24 ENCOUNTER — Encounter: Payer: Self-pay | Admitting: Family Medicine

## 2018-02-08 ENCOUNTER — Other Ambulatory Visit: Payer: Self-pay | Admitting: Psychiatry

## 2018-02-26 ENCOUNTER — Other Ambulatory Visit: Payer: Self-pay | Admitting: Unknown Physician Specialty

## 2018-02-28 NOTE — Telephone Encounter (Signed)
Refill request approved for Singulair.

## 2018-02-28 NOTE — Telephone Encounter (Signed)
ROUTING TO CLOSE.

## 2018-02-28 NOTE — Telephone Encounter (Signed)
Requested medication (s) are due for refill today: yes  Requested medication (s) are on the active medication list: yes  Last refill:  01/19/17   Future visit scheduled: no  Notes to clinic:  Prescription for montelukast 10 mg tab expired on 01/19/18  Requested Prescriptions  Pending Prescriptions Disp Refills   montelukast (SINGULAIR) 10 MG tablet [Pharmacy Med Name: MONTELUKAST SOD 10 MG TABLET] 90 tablet 0    Sig: TAKE 1 TABLET BY MOUTH EVERYDAY AT BEDTIME     Pulmonology:  Leukotriene Inhibitors Passed - 02/26/2018 10:01 AM      Passed - Valid encounter within last 12 months    Recent Outpatient Visits          10 months ago Viral URI with cough   [facility] Part[facility] NearingLane, Rachel Elizabeth, PA-C   1 year ago Need for influenza vaccination   [facility] Gabriel CirriWicker, Cheryl, NP   1 year ago Supraclavicular adenopathy   [facility] Particia NearingLane, [place], [place]   1 year ago Closed fracture of multiple ribs with routine healing, unspecified laterality, subsequent encounter   [facility] Particia NearingLane, Rachel [place], [place]   1 year ago Cough   [facility] Roosvelt MaserLane, Rachel [place], [place]
